# Patient Record
Sex: Male | Born: 1950 | Race: White | Hispanic: No | Smoking: Current every day smoker
Health system: Southern US, Community
[De-identification: ages and names within clinical notes are randomized; demographics above are authoritative.]

## PROBLEM LIST (undated history)

## (undated) DIAGNOSIS — K449 Diaphragmatic hernia without obstruction or gangrene: Secondary | ICD-10-CM

## (undated) DIAGNOSIS — I10 Essential (primary) hypertension: Secondary | ICD-10-CM

## (undated) DIAGNOSIS — R079 Chest pain, unspecified: Secondary | ICD-10-CM

## (undated) DIAGNOSIS — D735 Infarction of spleen: Secondary | ICD-10-CM

## (undated) DIAGNOSIS — N189 Chronic kidney disease, unspecified: Secondary | ICD-10-CM

## (undated) DIAGNOSIS — H68109 Unspecified obstruction of Eustachian tube, unspecified ear: Secondary | ICD-10-CM

## (undated) DIAGNOSIS — C4490 Unspecified malignant neoplasm of skin, unspecified: Secondary | ICD-10-CM

## (undated) HISTORY — DX: Unspecified obstruction of eustachian tube, unspecified ear: H68.109

## (undated) HISTORY — PX: HERNIA REPAIR: SHX51

## (undated) HISTORY — DX: Infarction of spleen: D73.5

## (undated) HISTORY — DX: Essential (primary) hypertension: I10

## (undated) HISTORY — DX: Diaphragmatic hernia without obstruction or gangrene: K44.9

## (undated) HISTORY — DX: Chest pain, unspecified: R07.9

## (undated) HISTORY — DX: Unspecified malignant neoplasm of skin, unspecified: C44.90

---

## 2000-01-05 ENCOUNTER — Ambulatory Visit (HOSPITAL_COMMUNITY): Admission: RE | Admit: 2000-01-05 | Discharge: 2000-01-05 | Payer: Self-pay | Admitting: General Surgery

## 2010-10-22 ENCOUNTER — Encounter: Payer: Self-pay | Admitting: Urology

## 2012-06-24 ENCOUNTER — Encounter: Payer: Self-pay | Admitting: *Deleted

## 2012-06-25 ENCOUNTER — Encounter: Payer: Self-pay | Admitting: Cardiology

## 2012-06-25 ENCOUNTER — Encounter: Payer: Self-pay | Admitting: *Deleted

## 2012-06-25 ENCOUNTER — Ambulatory Visit (INDEPENDENT_AMBULATORY_CARE_PROVIDER_SITE_OTHER): Payer: Self-pay | Admitting: Cardiology

## 2012-06-25 VITALS — BP 220/130 | HR 82 | Ht 69.0 in | Wt 224.0 lb

## 2012-06-25 DIAGNOSIS — I10 Essential (primary) hypertension: Secondary | ICD-10-CM

## 2012-06-25 DIAGNOSIS — R079 Chest pain, unspecified: Secondary | ICD-10-CM

## 2012-06-25 DIAGNOSIS — H68109 Unspecified obstruction of Eustachian tube, unspecified ear: Secondary | ICD-10-CM

## 2012-06-25 MED ORDER — LISINOPRIL-HYDROCHLOROTHIAZIDE 20-12.5 MG PO TABS: 2.0000 | ORAL_TABLET | Freq: Every day | ORAL | Status: DC

## 2012-06-25 MED ORDER — AMLODIPINE BESYLATE 5 MG PO TABS: 5.0000 mg | ORAL_TABLET | Freq: Every day | ORAL | Status: DC

## 2012-06-25 NOTE — Progress Notes (Signed)
**Note Jeremiah-Identified via Obfuscation** Patient ID: Jeremiah Calhoun, male   DOB: 01/05/1951, 61 y.o.   MRN: [id number]  HPI: Initial Cardiology visit performed at the kind request of Dr. Renard Matter for evaluation of exertional dyspnea, exertional chest discomfort and hypertension.  This nice gentleman as being generally active and free of cardiac disease.  He has had long-standing hypertension that has been fairly well-controlled.  He has hyperlipidemia that is also treated pharmacologically.  He has not previously been evaluated by a cardiologist nor undergone any significant cardiac testing.  Over the past few months, he has noted a decline in exercise tolerance.  With marked exertion, he experiences dyspnea and chest aching with radiation to both arms.  Symptoms resolve quickly with rest.  Current Outpatient Prescriptions on File Prior to Visit  Medication Sig Dispense Refill  . atorvastatin (LIPITOR) 20 MG tablet Take 20 mg by mouth daily.      . Azilsartan-Chlorthalidone (EDARBYCLOR) 40-12.5 MG TABS Take 1 tablet by mouth daily.      . hydrALAZINE (APRESOLINE) 25 MG tablet Take 25 mg by mouth 2 (two) times daily.        Past Medical History  Diagnosis Date  . Hypertension   . Blocked eustachian tube     Past Surgical History  Procedure Date  . Hernia repair     Family History  Problem Relation Age of Onset  . Heart attack Mother 51    Cause of death  . Heart attack Father 34    Cause of death  . Hypertension Sister     History   Social History  . Marital Status: Divorced    Spouse Name: N/A    Number of Children: N/A  . Years of Education: N/A   Occupational History  . Not on file.   Social History Main Topics  . Smoking status: Current Every Day Smoker -- 1.5 packs/day for 15 years    Types: Cigarettes  . Smokeless tobacco: Not on file  . Alcohol Use: Yes     Rare  . Drug Use: No  . Sexually Active: Not on file   Other Topics Concern  . Not on file   Social History Narrative  . No narrative on file      ROS: Recent fullness in the left ear; evaluated by PCP; nasal decongestant prescribed.  All other systems reviewed and are negative.  PHYSICAL EXAM: BP 220/130  Pulse 82  Ht 5\' 9"  (1.753 m)  Wt 101.606 kg (224 lb)  BMI 33.08 kg/m2  General-Well-developed; no acute distress Body [place]-overweight HEENT-[place]/AT; PERRL; EOM intact; conjunctiva and lids nl Neck-No JVD; no carotid bruits Endocrine-No thyromegaly Lungs-Clear lung fields; resonant percussion; normal I-to-E ratio Cardiovascular- normal PMI; normal S1 and S2 Abdomen-BS normal; soft and non-tender without masses or organomegaly Musculoskeletal-No deformities, cyanosis or clubbing Neurologic-Nl cranial nerves; symmetric strength and tone Skin- Warm, no significant lesions Extremities-Nl distal pulses; trace edema  EKG:  Normal this rhythm, abnormal R wave progression-lead placement error versus anterior infarction, minor nonspecific ST segment abnormality   ASSESSMENT AND PLAN:  Jeremiah Baca Bing, MD 06/25/2012 2:08 PM

## 2012-06-25 NOTE — Assessment & Plan Note (Signed)
Suboptimal control of hyperlipidemia.  Additional medical therapy should be considered.

## 2012-06-25 NOTE — Patient Instructions (Signed)
Your physician recommends that you schedule a follow-up appointment in: Day of stress test  Stress Myoview  Your physician recommends that you return for lab work in: 2 weeks and 4 weeks (you will receive a letter)  Your physician has requested that you regularly monitor and record your blood pressure readings at home. Please use the same machine at the same time of day to check your readings and record them to bring to your follow-up visit. (Call us if top number is > 170 or bottom number < 110)  Your physician has recommended you make the following change in your medication:  1 - START Lisinopril 20/12.5 2 tablets daily 2 - START Amlodipine 5 mg daily 3 - STOP Hydralazine 4 - STOP Edarbyclor  STOP Smoking  NO Strenuous Activity

## 2012-06-25 NOTE — Progress Notes (Deleted)
Name: Jeremiah Calhoun    DOB: 1951-08-22  Age: 61 y.o.  MR#: [id number]       PCP:  Alice Reichert, MD      Insurance: @PAYORNAME @   CC:    Chief Complaint  Patient presents with  . Chest Pain    VS BP 220/130  Pulse 82  Ht 5\' 9"  (1.753 m)  Wt 224 lb (101.606 kg)  BMI 33.08 kg/m2  Weights Current Weight  06/25/12 224 lb (101.606 kg)    Blood Pressure  BP Readings from Last 3 Encounters:  06/25/12 220/130     Admit date:  (Not on file) Last encounter with RMR:  Visit date not found   Allergy Not on File  Current Outpatient Prescriptions  Medication Sig Dispense Refill  . atorvastatin (LIPITOR) 20 MG tablet Take 20 mg by mouth daily.      . Azilsartan-Chlorthalidone (EDARBYCLOR) 40-12.5 MG TABS Take 1 tablet by mouth daily.      . hydrALAZINE (APRESOLINE) 25 MG tablet Take 25 mg by mouth 2 (two) times daily.        Discontinued Meds:   There are no discontinued medications.  There is no problem list on file for this patient.   LABS No results found for any previous visit.   Results for this Opt Visit:    No results found for this or any previous visit.  EKG No orders found for this or any previous visit.   Prior Assessment and Plan    Imaging: No results found.   FRS Calculation: Score not calculated. Missing: Total Cholesterol, HDL

## 2012-06-25 NOTE — Assessment & Plan Note (Signed)
Chest discomforthis consistent with exercise-induced ischemia, but may simply represent exercise-induced dyspnea.  Blood pressure is currently too high to allow stress testing to proceed safely.  Patient advised not to perform any significant exertion pending a stress test next week.

## 2012-06-25 NOTE — Assessment & Plan Note (Addendum)
History of hypokalemia with diuretic therapy.  Serum electrolytes will be reassessed.  Control of hypertension is inadequate.  Moreover, patient cannot afford his current medication, which will be discontinued.  Lisinopril-HCT 40/25 mg will be started as well as amlodipine 10 mg per day.  Patient will monitor blood pressure at home and return next week for treadmill testing.

## 2012-07-03 ENCOUNTER — Encounter (HOSPITAL_COMMUNITY): Payer: Self-pay | Admitting: *Deleted

## 2012-07-03 ENCOUNTER — Ambulatory Visit (HOSPITAL_COMMUNITY)
Admission: RE | Admit: 2012-07-03 | Discharge: 2012-07-03 | Disposition: A | Discharge status: Home / Self Care | Payer: Self-pay | Source: Ambulatory Visit | Attending: Cardiology | Admitting: Cardiology

## 2012-07-03 ENCOUNTER — Encounter: Payer: Self-pay | Admitting: Cardiology

## 2012-07-03 ENCOUNTER — Encounter (HOSPITAL_COMMUNITY)
Admission: RE | Admit: 2012-07-03 | Discharge: 2012-07-03 | Disposition: A | Discharge status: Home / Self Care | Payer: Self-pay | Source: Ambulatory Visit | Attending: Cardiology | Admitting: Cardiology

## 2012-07-03 ENCOUNTER — Encounter (HOSPITAL_COMMUNITY): Payer: Self-pay

## 2012-07-03 DIAGNOSIS — R079 Chest pain, unspecified: Secondary | ICD-10-CM

## 2012-07-03 DIAGNOSIS — I10 Essential (primary) hypertension: Secondary | ICD-10-CM

## 2012-07-03 MED ORDER — TECHNETIUM TC 99M SESTAMIBI - CARDIOLITE
10.0000 | Freq: Once | INTRAVENOUS | Status: AC | PRN
  Administered 2012-07-03: 9.5 via INTRAVENOUS

## 2012-07-03 MED ORDER — REGADENOSON 0.4 MG/5ML IV SOLN
INTRAVENOUS | Status: AC
  Filled 2012-07-03: qty 5

## 2012-07-03 MED ORDER — TECHNETIUM TC 99M SESTAMIBI - CARDIOLITE
30.0000 | Freq: Once | INTRAVENOUS | Status: AC | PRN
  Administered 2012-07-03: 30 via INTRAVENOUS

## 2012-07-03 MED ORDER — SODIUM CHLORIDE 0.9 % IJ SOLN
INTRAMUSCULAR | Status: AC
  Administered 2012-07-03: 10 mL via INTRAVENOUS
  Filled 2012-07-03: qty 10

## 2012-07-03 MED ORDER — METOPROLOL SUCCINATE ER 100 MG PO TB24: 100.0000 mg | ORAL_TABLET | Freq: Every day | ORAL | Status: DC

## 2012-07-03 NOTE — Progress Notes (Signed)
Stress Lab Nurses Notes - [facility]  DEMONE GAVIDIA 07/03/2012 Reason for doing test: Chest Pain and Dyspnea Type of test: Stress Cardiolite Nurse performing test: Parke Poisson, RN Nuclear Medicine Tech: Lyndel Pleasure Echo Tech: Not Applicable MD performing test: R. Rothbart Family MD: McInnis Test explained and consent signed: yes IV started: 22g jelco, Saline lock flushed, No redness or edema and Saline lock started in radiology Symptoms: Fatigue & SOB Treatment/Intervention: None Reason test stopped: fatigue and reached target HR After recovery IV was: Discontinued via X-ray tech and No redness or edema Patient to return to Nuc. Med at : 12:00 Patient discharged: Home Patient's Condition upon discharge was: stable Comments: During test peak BP 222/104 & HR 139.  Recovery BP 158/103 & HR 94.  Symptoms resolved in recovery. Erskine Speed T

## 2012-07-03 NOTE — Progress Notes (Signed)
Per Dr Dietrich Pates, cancel today's appointment and send in a script for Toprol 100 mg 1/2 tablet daily.

## 2012-07-04 ENCOUNTER — Other Ambulatory Visit: Payer: Self-pay | Admitting: *Deleted

## 2012-07-04 DIAGNOSIS — R079 Chest pain, unspecified: Secondary | ICD-10-CM

## 2012-07-07 ENCOUNTER — Encounter: Payer: Self-pay | Admitting: *Deleted

## 2012-07-07 NOTE — Progress Notes (Signed)
This encounter was created in error - please disregard.

## 2012-07-09 LAB — BASIC METABOLIC PANEL
BUN: 22 mg/dL (ref 6–23)
CO2: 32 mEq/L (ref 19–32)
Calcium: 9.7 mg/dL (ref 8.4–10.5)
Chloride: 99 mEq/L (ref 96–112)
Creat: 1.43 mg/dL — ABNORMAL HIGH (ref 0.50–1.35)
Glucose, Bld: 81 mg/dL (ref 70–99)
Potassium: 3.6 mEq/L (ref 3.5–5.3)
Sodium: 141 mEq/L (ref 135–145)

## 2012-07-10 ENCOUNTER — Encounter: Payer: Self-pay | Admitting: *Deleted

## 2012-07-10 ENCOUNTER — Other Ambulatory Visit: Payer: Self-pay | Admitting: *Deleted

## 2012-07-10 ENCOUNTER — Encounter: Payer: Self-pay | Admitting: Cardiology

## 2012-07-10 DIAGNOSIS — I10 Essential (primary) hypertension: Secondary | ICD-10-CM

## 2012-07-10 DIAGNOSIS — N182 Chronic kidney disease, stage 2 (mild): Secondary | ICD-10-CM | POA: Insufficient documentation

## 2012-07-11 ENCOUNTER — Encounter: Payer: Self-pay | Admitting: *Deleted

## 2012-07-18 ENCOUNTER — Other Ambulatory Visit: Payer: Self-pay | Admitting: *Deleted

## 2012-07-18 DIAGNOSIS — R079 Chest pain, unspecified: Secondary | ICD-10-CM

## 2012-07-23 LAB — BASIC METABOLIC PANEL
BUN: 21 mg/dL (ref 6–23)
CO2: 30 mEq/L (ref 19–32)
Calcium: 9.3 mg/dL (ref 8.4–10.5)
Chloride: 98 mEq/L (ref 96–112)
Creat: 1.53 mg/dL — ABNORMAL HIGH (ref 0.50–1.35)
Glucose, Bld: 95 mg/dL (ref 70–99)
Potassium: 3.2 mEq/L — ABNORMAL LOW (ref 3.5–5.3)
Sodium: 137 mEq/L (ref 135–145)

## 2012-07-24 ENCOUNTER — Encounter: Payer: Self-pay | Admitting: Cardiology

## 2012-07-29 ENCOUNTER — Other Ambulatory Visit: Payer: Self-pay | Admitting: *Deleted

## 2012-07-29 ENCOUNTER — Encounter: Payer: Self-pay | Admitting: *Deleted

## 2012-07-29 DIAGNOSIS — I10 Essential (primary) hypertension: Secondary | ICD-10-CM

## 2012-08-13 ENCOUNTER — Encounter: Payer: Self-pay | Admitting: *Deleted

## 2012-08-13 ENCOUNTER — Ambulatory Visit (INDEPENDENT_AMBULATORY_CARE_PROVIDER_SITE_OTHER): Payer: Self-pay | Admitting: Cardiology

## 2012-08-13 ENCOUNTER — Encounter: Payer: Self-pay | Admitting: Cardiology

## 2012-08-13 VITALS — BP 220/130 | HR 70 | Ht 69.0 in | Wt 220.0 lb

## 2012-08-13 DIAGNOSIS — I10 Essential (primary) hypertension: Secondary | ICD-10-CM

## 2012-08-13 DIAGNOSIS — H68109 Unspecified obstruction of Eustachian tube, unspecified ear: Secondary | ICD-10-CM

## 2012-08-13 DIAGNOSIS — E876 Hypokalemia: Secondary | ICD-10-CM | POA: Insufficient documentation

## 2012-08-13 DIAGNOSIS — R079 Chest pain, unspecified: Secondary | ICD-10-CM

## 2012-08-13 DIAGNOSIS — E782 Mixed hyperlipidemia: Secondary | ICD-10-CM

## 2012-08-13 MED ORDER — AMLODIPINE BESYLATE 10 MG PO TABS: 10.0000 mg | ORAL_TABLET | Freq: Every day | ORAL | Status: DC

## 2012-08-13 MED ORDER — SPIRONOLACTONE 25 MG PO TABS: 25.0000 mg | ORAL_TABLET | Freq: Every day | ORAL | Status: DC

## 2012-08-13 MED ORDER — METOPROLOL SUCCINATE ER 100 MG PO TB24: 100.0000 mg | ORAL_TABLET | Freq: Every day | ORAL | Status: DC

## 2012-08-13 NOTE — Progress Notes (Deleted)
Name: Jeremiah Calhoun    DOB: 11/04/1950  Age: 61 y.o.  MR#: [id number]       PCP:  Alice Reichert, MD      Insurance: @PAYORNAME @   CC:   No chief complaint on file.  Medication list reviewed BMET due 11/25, due to K+ of 3.2  VS BP 184/110  Pulse 70  Ht 5\' 9"  (1.753 m)  Wt 220 lb (99.791 kg)  BMI 32.49 kg/m2  SpO2 96%  Weights Current Weight  08/13/12 220 lb (99.791 kg)  06/25/12 224 lb (101.606 kg)    Blood Pressure  BP Readings from Last 3 Encounters:  08/13/12 184/110  06/25/12 220/130     Admit date:  (Not on file) Last encounter with RMR:  07/24/2012   Allergy Allergies  Allergen Reactions  . Codeine Nausea And Vomiting    Current Outpatient Prescriptions  Medication Sig Dispense Refill  . amLODipine (NORVASC) 5 MG tablet Take 1 tablet (5 mg total) by mouth daily.  30 tablet  11  . Ascorbic Acid (VITAMIN C) 1000 MG tablet Take 1,000 mg by mouth daily.      Marland Kitchen aspirin 81 MG tablet Take 81 mg by mouth daily.      Marland Kitchen atorvastatin (LIPITOR) 20 MG tablet Take 20 mg by mouth daily.      . Coenzyme Q10 (CO Q 10) 100 MG CAPS Take 100 mg by mouth daily.      . Flaxseed, Linseed, (FLAX SEED OIL) 1000 MG CAPS Take 1,000 mg by mouth daily.      Marland Kitchen lisinopril-hydrochlorothiazide (PRINZIDE,ZESTORETIC) 20-12.5 MG per tablet Take 2 tablets by mouth daily.  60 tablet  11  . Lycopene 10 MG CAPS Take 10 mg by mouth daily.      . metoprolol succinate (TOPROL-XL) 100 MG 24 hr tablet Take 1 tablet (100 mg total) by mouth daily. Take 1/2 tablet with or immediately following a meal.  30 tablet  6  . Multiple Vitamin (MULTIVITAMIN) tablet Take 1 tablet by mouth daily.      . Omega-3 Fatty Acids (FISH OIL) 1200 MG CAPS Take 1,200 mg by mouth daily.      . potassium chloride SA (K-DUR,KLOR-CON) 20 MEQ tablet Take 20 mEq by mouth daily.      . saw palmetto 160 MG capsule Take 160 mg by mouth daily.        Discontinued Meds:   There are no discontinued medications.  Patient Active Problem  List  Diagnosis  . Chest pain, exertional  . Hyperlipidemia  . Hypertension  . Chronic kidney disease, stage II (mild)    LABS Orders Only on 07/18/2012  Component Date Value  . Sodium 07/23/2012 137   . Potassium 07/23/2012 3.2*  . Chloride 07/23/2012 98   . CO2 07/23/2012 30   . Glucose, Bld 07/23/2012 95   . BUN 07/23/2012 21   . Creat 07/23/2012 1.53*  . Calcium 07/23/2012 9.3   Orders Only on 07/04/2012  Component Date Value  . Sodium 07/09/2012 141   . Potassium 07/09/2012 3.6   . Chloride 07/09/2012 99   . CO2 07/09/2012 32   . Glucose, Bld 07/09/2012 81   . BUN 07/09/2012 22   . Creat 07/09/2012 1.43*  . Calcium 07/09/2012 9.7      Results for this Opt Visit:     Results for orders placed in visit on 07/18/12  BASIC METABOLIC PANEL      Component Value Range   Sodium  137  135 - 145 mEq/L   Potassium 3.2 (*) 3.5 - 5.3 mEq/L   Chloride 98  96 - 112 mEq/L   CO2 30  19 - 32 mEq/L   Glucose, Bld 95  70 - 99 mg/dL   BUN 21  6 - 23 mg/dL   Creat 1.61 (*) 0.96 - 1.35 mg/dL   Calcium 9.3  8.4 - 04.5 mg/dL    EKG Orders placed in visit on 06/25/12  . EKG 12-LEAD     Prior Assessment and Plan Problem List as of 08/13/2012            Cardiology Problems   Hypertension   Last Assessment & Plan Note   06/25/2012 Office Visit Addendum 07/01/2012  9:11 PM by Kathlen Brunswick, MD    History of hypokalemia with diuretic therapy.  Serum electrolytes will be reassessed.  Control of hypertension is inadequate.  Moreover, patient cannot afford his current medication, which will be discontinued.  Lisinopril-HCT 40/25 mg will be started as well as amlodipine 10 mg per day.  Patient will monitor blood pressure at home and return next week for treadmill testing.      Other   Chest pain, exertional   Last Assessment & Plan Note   06/25/2012 Office Visit Signed 06/25/2012  3:25 PM by Kathlen Brunswick, MD    Chest discomforthis consistent with exercise-induced ischemia, but  may simply represent exercise-induced dyspnea.  Blood pressure is currently too high to allow stress testing to proceed safely.  Patient advised not to perform any significant exertion pending a stress test next week.     Hyperlipidemia   Last Assessment & Plan Note   06/25/2012 Office Visit Signed 06/25/2012  3:26 PM by Kathlen Brunswick, MD    Suboptimal control of hyperlipidemia.  Additional medical therapy should be considered.    Chronic kidney disease, stage II (mild)       Imaging: No results found.   FRS Calculation: Score not calculated. Missing: Total Cholesterol, HDL

## 2012-08-13 NOTE — Progress Notes (Signed)
Patient ID: Jeremiah Calhoun, male   DOB: Jun 13, 1951, 61 y.o.   MRN: [id number]  HPI: Scheduled return visit for this very nice gentleman with hypertension and exertional chest discomfort.  Since appropriate medical therapy was initiated, chest discomfort has resolved.  Exercise tolerance is also improved back to normal.  He is splitting wood and hunting without any cardiopulmonary symptoms.  Prior to Admission medications   Medication Sig Start Date End Date Taking? Authorizing Provider  amLODipine (NORVASC) 5 MG tablet Take 1 tablet (5 mg total) by mouth daily. 06/25/12  Yes Kathlen Brunswick, MD  Ascorbic Acid (VITAMIN C) 1000 MG tablet Take 1,000 mg by mouth daily.   Yes Historical Provider, MD  aspirin 81 MG tablet Take 81 mg by mouth daily.   Yes Historical Provider, MD  atorvastatin (LIPITOR) 20 MG tablet Take 20 mg by mouth daily.   Yes Historical Provider, MD  Coenzyme Q10 (CO Q 10) 100 MG CAPS Take 100 mg by mouth daily.   Yes Historical Provider, MD  Flaxseed, Linseed, (FLAX SEED OIL) 1000 MG CAPS Take 1,000 mg by mouth daily.   Yes Historical Provider, MD  lisinopril-hydrochlorothiazide (PRINZIDE,ZESTORETIC) 20-12.5 MG per tablet Take 2 tablets by mouth daily. 06/25/12  Yes Kathlen Brunswick, MD  Lycopene 10 MG CAPS Take 10 mg by mouth daily.   Yes Historical Provider, MD  metoprolol succinate (TOPROL-XL) 100 MG 24 hr tablet Take 1 tablet (100 mg total) by mouth daily. Take 1/2 tablet with or immediately following a meal. 07/03/12  Yes Kathlen Brunswick, MD  Multiple Vitamin (MULTIVITAMIN) tablet Take 1 tablet by mouth daily.   Yes Historical Provider, MD  Omega-3 Fatty Acids (FISH OIL) 1200 MG CAPS Take 1,200 mg by mouth daily.   Yes Historical Provider, MD  potassium chloride SA (K-DUR,KLOR-CON) 20 MEQ tablet Take 20 mEq by mouth daily.   Yes Historical Provider, MD  saw palmetto 160 MG capsule Take 160 mg by mouth daily.   Yes Historical Provider, MD   Allergies  Allergen Reactions  .  Codeine Nausea And Vomiting     Past medical history, social history, and family history reviewed and updated.  ROS: Denies orthopnea, PND, pedal edema or syncope.  He notes mild intermittent diarrhea with 2 stools per day.  Nocturia 2 times per night resolved when he added lycopene to his other nutriceuticals.  All other systems reviewed and are negative.  PHYSICAL EXAM: BP 220/130  Pulse 70  Ht 5\' 9"  (1.753 m)  Wt 99.791 kg (220 lb)  BMI 32.49 kg/m2  SpO2 96% ; subsequent blood pressure 180/110 General-Well developed; no acute distress Body habitus-Mildly overweight Neck-No JVD; no carotid bruits Lungs-clear lung fields; resonant to percussion Cardiovascular-normal PMI; normal S1 and S2 Abdomen-normal bowel sounds; soft and non-tender without masses or organomegaly Musculoskeletal-No deformities, no cyanosis or clubbing Neurologic-Normal cranial nerves; symmetric strength and tone Skin-Warm, no significant lesions Extremities-distal pulses intact; no edema  ASSESSMENT AND PLAN:  [place] Bing, MD 08/13/2012 2:02 PM

## 2012-08-13 NOTE — Patient Instructions (Addendum)
Your physician recommends that you schedule a follow-up appointment in:  1 - 1 month with nurse for blood pressure check 2 - 2 months with provider  Your physician recommends that you return for lab work in: 3 weeks and you will receive a reminder letter  Your physician has recommended you make the following change in your medication:  1 - INCREASE Amlodipine to 10 mg daily 2 - INCREASE Toprol to 100 mg daily 3 - START Aldactone 25 mg daily 4 - STOP Potassium

## 2012-08-13 NOTE — Assessment & Plan Note (Signed)
Blood pressure control is inadequate.  Dose of amlodipine and metoprolol will be increased.  Aldactone will be added to antihypertensive regime with discontinuation of potassium supplement and close monitoring of electrolytes and renal function.

## 2012-08-13 NOTE — Assessment & Plan Note (Signed)
Repeat lipid profile on therapy will be obtained.

## 2012-08-13 NOTE — Assessment & Plan Note (Signed)
Stable renal function despite treatment with thiazide diuretic and ACE inhibitor.  We will continue to monitor electrolytes and renal function closely as his antihypertensive regimen is adjusted.

## 2012-08-13 NOTE — Assessment & Plan Note (Signed)
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  In the absence of ischemia, this is a low risk study.  We will optimize therapy of cardiovascular risk factors, but defer additional diagnostic testing for coronary artery disease.

## 2012-08-27 ENCOUNTER — Other Ambulatory Visit: Payer: Self-pay | Admitting: *Deleted

## 2012-08-27 DIAGNOSIS — E782 Mixed hyperlipidemia: Secondary | ICD-10-CM

## 2012-08-27 DIAGNOSIS — I10 Essential (primary) hypertension: Secondary | ICD-10-CM

## 2012-09-03 LAB — BASIC METABOLIC PANEL
BUN: 22 mg/dL (ref 6–23)
CO2: 27 mEq/L (ref 19–32)
Calcium: 9.6 mg/dL (ref 8.4–10.5)
Chloride: 105 mEq/L (ref 96–112)
Creat: 1.34 mg/dL (ref 0.50–1.35)
Glucose, Bld: 99 mg/dL (ref 70–99)
Potassium: 4.4 mEq/L (ref 3.5–5.3)
Sodium: 140 mEq/L (ref 135–145)

## 2012-09-03 LAB — LIPID PANEL
Cholesterol: 169 mg/dL (ref 0–200)
HDL: 33 mg/dL — ABNORMAL LOW (ref >39)
LDL Cholesterol: 108 mg/dL — ABNORMAL HIGH (ref 0–99)
Total CHOL/HDL Ratio: 5.1 Ratio
Triglycerides: 142 mg/dL (ref <150)
VLDL: 28 mg/dL (ref 0–40)

## 2012-09-04 ENCOUNTER — Encounter: Payer: Self-pay | Admitting: Cardiology

## 2012-09-08 ENCOUNTER — Other Ambulatory Visit: Payer: Self-pay | Admitting: *Deleted

## 2012-09-12 ENCOUNTER — Ambulatory Visit (INDEPENDENT_AMBULATORY_CARE_PROVIDER_SITE_OTHER): Payer: Self-pay | Admitting: *Deleted

## 2012-09-12 VITALS — BP 138/90 | HR 75 | Ht 69.0 in | Wt 223.0 lb

## 2012-09-12 DIAGNOSIS — I10 Essential (primary) hypertension: Secondary | ICD-10-CM

## 2012-09-12 NOTE — Progress Notes (Signed)
Presents for return BP check.  States that he has been unable to keep track of blood pressures at home, due to financial constraints and he is unable to purchase a new cuff, as his is not working properly.  VS on 11/13....BP 220/130  Pulse 70 orders were as follows:  1 - INCREASE Amlodipine to 10 mg daily  2 - INCREASE Toprol to 100 mg daily  3 - START Aldactone 25 mg daily  4 - STOP Potassium    Denies complaints and states that he feels much better at this point, however has not been taking atorvastatin due to cost.  We could consider Crestor and enroll in free program, as well as samples.  Please advise

## 2012-09-13 NOTE — Progress Notes (Signed)
Control of hypertension is markedly improved. Change atorvastatin to pravastatin 80 mg per day. CMet and fasting lipid profile in one month.

## 2012-09-13 NOTE — Progress Notes (Signed)
Patient ID: Jeremiah Calhoun, male   DOB: 10-Jul-1951, 61 y.o.   MRN: [id number]  Dramatic improvement in control of hypertension. Patient could monitor blood pressure at local pharmacies or at our office if he has transportation. Pravastatin 80 mg per day; fasting lipid profile in one month.

## 2012-09-15 NOTE — Progress Notes (Signed)
Attempted to contact patient LM for a return call.

## 2012-09-16 ENCOUNTER — Encounter: Payer: Self-pay | Admitting: *Deleted

## 2012-09-16 ENCOUNTER — Other Ambulatory Visit: Payer: Self-pay | Admitting: *Deleted

## 2012-09-16 DIAGNOSIS — E782 Mixed hyperlipidemia: Secondary | ICD-10-CM

## 2012-09-16 MED ORDER — PRAVASTATIN SODIUM 80 MG PO TABS: 80.0000 mg | ORAL_TABLET | Freq: Every evening | ORAL | Status: DC

## 2012-09-16 NOTE — Telephone Encounter (Signed)
Unable to get in touch with patient, therefore a letter has been sent to instruct him to begin pravastatin 80 mg daily.

## 2012-09-17 ENCOUNTER — Telehealth: Payer: Self-pay | Admitting: *Deleted

## 2012-09-17 NOTE — Telephone Encounter (Signed)
Patient verbalized understanding of medication changes and will pick pravastatin up today.

## 2012-10-17 ENCOUNTER — Other Ambulatory Visit: Payer: Self-pay | Admitting: Cardiology

## 2012-10-17 ENCOUNTER — Ambulatory Visit: Payer: Self-pay | Admitting: Cardiology

## 2012-10-17 DIAGNOSIS — E782 Mixed hyperlipidemia: Secondary | ICD-10-CM

## 2012-10-17 LAB — LIPID PANEL
Cholesterol: 138 mg/dL (ref 0–200)
HDL: 35 mg/dL — ABNORMAL LOW (ref >39)
LDL Cholesterol: 72 mg/dL (ref 0–99)
Total CHOL/HDL Ratio: 3.9 Ratio
Triglycerides: 155 mg/dL — ABNORMAL HIGH (ref <150)
VLDL: 31 mg/dL (ref 0–40)

## 2012-10-22 ENCOUNTER — Encounter: Payer: Self-pay | Admitting: *Deleted

## 2012-11-13 ENCOUNTER — Ambulatory Visit: Payer: Self-pay | Admitting: Cardiology

## 2012-11-28 ENCOUNTER — Encounter: Payer: Self-pay | Admitting: *Deleted

## 2012-11-28 ENCOUNTER — Encounter: Payer: Self-pay | Admitting: Cardiology

## 2012-11-28 ENCOUNTER — Ambulatory Visit (INDEPENDENT_AMBULATORY_CARE_PROVIDER_SITE_OTHER): Payer: Self-pay | Admitting: Cardiology

## 2012-11-28 VITALS — BP 158/98 | HR 73 | Ht 69.0 in | Wt 221.4 lb

## 2012-11-28 DIAGNOSIS — N182 Chronic kidney disease, stage 2 (mild): Secondary | ICD-10-CM

## 2012-11-28 DIAGNOSIS — R079 Chest pain, unspecified: Secondary | ICD-10-CM

## 2012-11-28 DIAGNOSIS — I10 Essential (primary) hypertension: Secondary | ICD-10-CM

## 2012-11-28 DIAGNOSIS — E876 Hypokalemia: Secondary | ICD-10-CM

## 2012-11-28 NOTE — Progress Notes (Deleted)
Name: Jeremiah Calhoun    DOB: [birth year]  Age: 62 y.o.  MR#: [id number]       PCP:  Alice Reichert, MD      Insurance: Payor:  No coverage found.   CC:   No chief complaint on file.  MEDICATION LIST  VS Filed Vitals:   11/28/12 1436  BP: 158/98  Pulse: 73  Height: 5\' 9"  (1.753 m)  Weight: 221 lb 6.4 oz (100.426 kg)  SpO2: 97%    Weights Current Weight  11/28/12 221 lb 6.4 oz (100.426 kg)  09/12/12 223 lb (101.152 kg)  08/13/12 220 lb (99.791 kg)    Blood Pressure  BP Readings from Last 3 Encounters:  11/28/12 158/98  09/12/12 138/90  08/13/12 220/130     Admit date:  (Not on file) Last encounter with RMR:  11/13/2012   Allergy Codeine  Current Outpatient Prescriptions  Medication Sig Dispense Refill  . amLODipine (NORVASC) 10 MG tablet Take 1 tablet (10 mg total) by mouth daily.  90 tablet  3  . Ascorbic Acid (VITAMIN C) 1000 MG tablet Take 1,000 mg by mouth daily.      Marland Kitchen aspirin 81 MG tablet Take 81 mg by mouth daily.      . Coenzyme Q10 (CO Q 10) 100 MG CAPS Take 100 mg by mouth daily.      . Flaxseed, Linseed, (FLAX SEED OIL) 1000 MG CAPS Take 1,000 mg by mouth daily.      Marland Kitchen lisinopril-hydrochlorothiazide (PRINZIDE,ZESTORETIC) 20-12.5 MG per tablet Take 2 tablets by mouth daily.  60 tablet  11  . Lycopene 10 MG CAPS Take 10 mg by mouth daily.      . metoprolol succinate (TOPROL-XL) 100 MG 24 hr tablet Take 1 tablet (100 mg total) by mouth daily. Take with or immediately following a meal.  90 tablet  3  . Multiple Vitamin (MULTIVITAMIN) tablet Take 1 tablet by mouth daily.      . Omega-3 Fatty Acids (FISH OIL) 1200 MG CAPS Take 1,200 mg by mouth daily.      . pravastatin (PRAVACHOL) 80 MG tablet Take 1 tablet (80 mg total) by mouth every evening.  30 tablet  6  . saw palmetto 160 MG capsule Take 160 mg by mouth daily.      Marland Kitchen spironolactone (ALDACTONE) 25 MG tablet Take 1 tablet (25 mg total) by mouth daily.  90 tablet  3   No current facility-administered medications  for this visit.    Discontinued Meds:   There are no discontinued medications.  Patient Active Problem List  Diagnosis  . Chest pain, exertional  . Hyperlipidemia  . Hypertension  . Chronic kidney disease, stage II (mild)  . Hypokalemia    LABS    Component Value Date/Time   NA 140 09/03/2012 0940   NA 137 07/23/2012 0850   NA 141 07/09/2012 1143   K 4.4 09/03/2012 0940   K 3.2* 07/23/2012 0850   K 3.6 07/09/2012 1143   CL 105 09/03/2012 0940   CL 98 07/23/2012 0850   CL 99 07/09/2012 1143   CO2 27 09/03/2012 0940   CO2 30 07/23/2012 0850   CO2 32 07/09/2012 1143   GLUCOSE 99 09/03/2012 0940   GLUCOSE 95 07/23/2012 0850   GLUCOSE 81 07/09/2012 1143   BUN 22 09/03/2012 0940   BUN 21 07/23/2012 0850   BUN 22 07/09/2012 1143   CREATININE 1.34 09/03/2012 0940   CREATININE 1.53* 07/23/2012 0850   CREATININE  1.43* 07/09/2012 1143   CALCIUM 9.6 09/03/2012 0940   CALCIUM 9.3 07/23/2012 0850   CALCIUM 9.7 07/09/2012 1143   CMP     Component Value Date/Time   NA 140 09/03/2012 0940   K 4.4 09/03/2012 0940   CL 105 09/03/2012 0940   CO2 27 09/03/2012 0940   GLUCOSE 99 09/03/2012 0940   BUN 22 09/03/2012 0940   CREATININE 1.34 09/03/2012 0940   CALCIUM 9.6 09/03/2012 0940    No results found for this basename: wbc, hgb, hct, mcv, platelets    Lipid Panel     Component Value Date/Time   CHOL 138 10/17/2012 1011   TRIG 155* 10/17/2012 1011   HDL 35* 10/17/2012 1011   CHOLHDL 3.9 10/17/2012 1011   VLDL 31 10/17/2012 1011   LDLCALC 72 10/17/2012 1011    ABG No results found for this basename: phart, pco2, pco2art, po2, po2art, hco3, tco2, acidbasedef, o2sat     No results found for this basename: TSH   BNP (last 3 results) No results found for this basename: PROBNP,  in the last 8760 hours Cardiac Panel (last 3 results) No results found for this basename: CKTOTAL, CKMB, TROPONINI, RELINDX,  in the last 72 hours  Iron/TIBC/Ferritin No results found for this basename: iron, tibc,  ferritin     EKG Orders placed in visit on 06/25/12  . EKG 12-LEAD     Prior Assessment and Plan Problem List as of 11/28/2012     ICD-9-CM     Cardiology Problems   Hypertension   Last Assessment & Plan   08/13/2012 Office Visit Written 08/13/2012  2:31 PM by Kathlen Brunswick, MD     Blood pressure control is inadequate.  Dose of amlodipine and metoprolol will be increased.  Aldactone will be added to antihypertensive regime with discontinuation of potassium supplement and close monitoring of electrolytes and renal function.      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  In the absence of ischemia, this is a low risk study.  We will optimize therapy of cardiovascular risk factors, but defer additional diagnostic testing for coronary artery disease.    Hyperlipidemia   Last Assessment & Plan   08/13/2012 Office Visit Written 08/13/2012  2:30 PM by Kathlen Brunswick, MD     Repeat lipid profile on therapy will be obtained.    Chronic kidney disease, stage II (mild)   Last Assessment & Plan   08/13/2012 Office Visit Written 08/13/2012  2:07 PM by Kathlen Brunswick, MD     Stable renal function despite treatment with thiazide diuretic and ACE inhibitor.  We will continue to monitor electrolytes and renal function closely as his antihypertensive regimen is adjusted.    Hypokalemia       Imaging: No results found.

## 2012-11-28 NOTE — Patient Instructions (Addendum)
Your physician recommends that you schedule a follow-up appointment in: 6 months  Your physician recommends that you return for lab work in: 3 months

## 2012-11-29 NOTE — Assessment & Plan Note (Signed)
Creatinine is lower than on determinations a few months ago indicating that renal disease is stable to improved and remains mild.

## 2012-11-29 NOTE — Assessment & Plan Note (Signed)
Recent lipid profiles are excellent with current therapy, which will be continued.

## 2012-11-29 NOTE — Assessment & Plan Note (Signed)
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 Continued medical treatment and optimal control of cardiovascular risk factors are appropriate.

## 2012-11-29 NOTE — Assessment & Plan Note (Signed)
Blood pressure control is suboptimal, but exact medical regimen is unclear. We will contact patient's pharmacy and adjust therapy based upon the information received.

## 2012-11-29 NOTE — Progress Notes (Signed)
Patient ID: Jeremiah Calhoun, male   DOB: [birth year], 62 y.o.   MRN: [id number]  HPI: Schedule return visit for this nice gentleman with hypertension and history of chest discomfort. He reports no recent symptoms. Recent blood pressure systems have been elevated.  Patient is uncertain whether she has been taking all of his medications and did not bring them with him.  Current Outpatient Prescriptions  Medication Sig Dispense Refill  . amLODipine (NORVASC) 10 MG tablet Take 1 tablet (10 mg total) by mouth daily.  90 tablet  3  . Ascorbic Acid (VITAMIN C) 1000 MG tablet Take 1,000 mg by mouth daily.      Marland Kitchen aspirin 81 MG tablet Take 81 mg by mouth daily.      . Coenzyme Q10 (CO Q 10) 100 MG CAPS Take 100 mg by mouth daily.      . Flaxseed, Linseed, (FLAX SEED OIL) 1000 MG CAPS Take 1,000 mg by mouth daily.      Marland Kitchen lisinopril-hydrochlorothiazide (PRINZIDE,ZESTORETIC) 20-12.5 MG per tablet Take 2 tablets by mouth daily.  60 tablet  11  . Lycopene 10 MG CAPS Take 10 mg by mouth daily.      . metoprolol succinate (TOPROL-XL) 100 MG 24 hr tablet Take 1 tablet (100 mg total) by mouth daily. Take with or immediately following a meal.  90 tablet  3  . Multiple Vitamin (MULTIVITAMIN) tablet Take 1 tablet by mouth daily.      . Omega-3 Fatty Acids (FISH OIL) 1200 MG CAPS Take 1,200 mg by mouth daily.      . pravastatin (PRAVACHOL) 80 MG tablet Take 1 tablet (80 mg total) by mouth every evening.  30 tablet  6  . saw palmetto 160 MG capsule Take 160 mg by mouth daily.      Marland Kitchen spironolactone (ALDACTONE) 25 MG tablet Take 1 tablet (25 mg total) by mouth daily.  90 tablet  3   No current facility-administered medications for this visit.   Allergies  Allergen Reactions  . Codeine Nausea And Vomiting     Past medical history, social history, and family history reviewed and updated.  ROS: Denies chest pain, palpitations, lightheadedness or syncope. All other systems reviewed and are negative.  PHYSICAL EXAM: BP  158/98  Pulse 73  Ht 5\' 9"  (1.753 m)  Wt 100.426 kg (221 lb 6.4 oz)  BMI 32.68 kg/m2  SpO2 97%;  Body mass index is 32.68 kg/(m^2). General-Well developed; no acute distress Body habitus-mildly to moderately overweight Neck-No JVD; no carotid bruits Lungs-clear lung fields; resonant to percussion Cardiovascular-normal PMI; normal S1 and S2 Abdomen-normal bowel sounds; soft and non-tender without masses or organomegaly Musculoskeletal-No deformities, no cyanosis or clubbing Neurologic-Normal cranial nerves; symmetric strength and tone Skin-Warm, no significant lesions Extremities-distal pulses intact; no edema   Bing, MD 11/29/2012  4:31 PM  ASSESSMENT AND PLAN

## 2013-03-11 ENCOUNTER — Encounter: Payer: Self-pay | Admitting: *Deleted

## 2013-05-01 ENCOUNTER — Other Ambulatory Visit: Payer: Self-pay | Admitting: *Deleted

## 2013-05-01 DIAGNOSIS — N182 Chronic kidney disease, stage 2 (mild): Secondary | ICD-10-CM

## 2013-05-01 DIAGNOSIS — E876 Hypokalemia: Secondary | ICD-10-CM

## 2013-05-01 DIAGNOSIS — I10 Essential (primary) hypertension: Secondary | ICD-10-CM

## 2013-07-09 ENCOUNTER — Other Ambulatory Visit: Payer: Self-pay | Admitting: Cardiology

## 2013-07-28 ENCOUNTER — Telehealth: Payer: Self-pay | Admitting: *Deleted

## 2013-07-28 NOTE — Telephone Encounter (Signed)
.  left message to have patient return my call to call office to scheduled f/u OV per noted pt is overdue, in order to continue to fill medications

## 2013-07-31 ENCOUNTER — Ambulatory Visit (INDEPENDENT_AMBULATORY_CARE_PROVIDER_SITE_OTHER): Payer: Self-pay | Admitting: Cardiovascular Disease

## 2013-07-31 ENCOUNTER — Encounter: Payer: Self-pay | Admitting: Cardiovascular Disease

## 2013-07-31 VITALS — BP 118/70 | HR 69 | Ht 69.0 in | Wt 219.0 lb

## 2013-07-31 DIAGNOSIS — R9439 Abnormal result of other cardiovascular function study: Secondary | ICD-10-CM

## 2013-07-31 DIAGNOSIS — I208 Other forms of angina pectoris: Secondary | ICD-10-CM

## 2013-07-31 DIAGNOSIS — I10 Essential (primary) hypertension: Secondary | ICD-10-CM

## 2013-07-31 DIAGNOSIS — E785 Hyperlipidemia, unspecified: Secondary | ICD-10-CM

## 2013-07-31 DIAGNOSIS — I209 Angina pectoris, unspecified: Secondary | ICD-10-CM

## 2013-07-31 DIAGNOSIS — R079 Chest pain, unspecified: Secondary | ICD-10-CM

## 2013-07-31 DIAGNOSIS — I2089 Other forms of angina pectoris: Secondary | ICD-10-CM

## 2013-07-31 DIAGNOSIS — E782 Mixed hyperlipidemia: Secondary | ICD-10-CM

## 2013-07-31 DIAGNOSIS — R931 Abnormal findings on diagnostic imaging of heart and coronary circulation: Secondary | ICD-10-CM

## 2013-07-31 MED ORDER — LISINOPRIL-HYDROCHLOROTHIAZIDE 20-12.5 MG PO TABS: ORAL_TABLET | ORAL | Status: DC

## 2013-07-31 MED ORDER — METOPROLOL SUCCINATE ER 100 MG PO TB24: 100.0000 mg | ORAL_TABLET | Freq: Every day | ORAL | Status: DC

## 2013-07-31 MED ORDER — AMLODIPINE BESYLATE 10 MG PO TABS: 10.0000 mg | ORAL_TABLET | Freq: Every day | ORAL | Status: DC

## 2013-07-31 MED ORDER — SPIRONOLACTONE 25 MG PO TABS: 25.0000 mg | ORAL_TABLET | Freq: Every day | ORAL | Status: DC

## 2013-07-31 MED ORDER — PRAVASTATIN SODIUM 80 MG PO TABS: 80.0000 mg | ORAL_TABLET | Freq: Every evening | ORAL | Status: DC

## 2013-07-31 NOTE — Addendum Note (Signed)
Addended by: Lisabeth Devoid F on: 07/31/2013 02:44 PM   Modules accepted: Orders

## 2013-07-31 NOTE — Patient Instructions (Addendum)
Your physician wants you to follow-up in: 6 months with Dr. Purvis Sheffield.  You will receive a reminder letter in the mail two months in advance. If you don't receive a letter, please call our office to schedule the follow-up appointment.  Your physician recommends that you continue on your current medications as directed. Please refer to the Current Medication list given to you today.   You will need lab work today: cholesterol panel We will call with your results

## 2013-07-31 NOTE — Progress Notes (Signed)
Patient ID: Jeremiah Calhoun, male   DOB: May 02, 1951, 62 y.o.   MRN: [id number]      SUBJECTIVE: Jeremiah Calhoun has a h/o exertional chest pain, HTN, hyperlipidemia, and CKD. He underwent nuclear stress testing on 07-04-2012 which revealed the following:  IMPRESSION: Abnormal stress nuclear myocardial study revealing somewhat impaired exercise capacity, mild left ventricular enlargement, mildly impaired left ventricular systolic function in a segmental pattern and significant stress-induced ischemic EKG abnormalities in the setting of baseline ST-segment depression and no exercise related chest discomfort. By scintigraphic imaging, there was possible mild scarring at the base of the inferior wall without evidence for ischemia. Other findings as noted. These findings suggest a low risk for significant cardiovascular events in the near and intermediate term.  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 He says, "99.9% of the time I feel ok". He seldomly experiences bilateral arm aching with exertion, particularly when walking uphill, which is relieved with rest. He denies chest pain, palpitations, and lightheadedness. He may feel mild shortness of breath during these episodes.   He has never taken SL nitro for this, as it caused him severe headaches in the past.  He has a hiatal hernia and had an EGD "several years ago".  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   Allergies  Allergen Reactions  . Codeine Nausea And Vomiting    Current Outpatient Prescriptions  Medication Sig Dispense Refill  . amLODipine (NORVASC) 10 MG tablet Take 1 tablet (10 mg total) by mouth daily.  90 tablet  3  . Ascorbic Acid (VITAMIN C) 1000 MG tablet Take 1,000 mg by mouth daily.      Marland Kitchen aspirin 81 MG tablet Take 81 mg by mouth daily.      . Coenzyme Q10 (CO Q 10) 100 MG CAPS Take 100 mg by mouth daily.      . Flaxseed, Linseed, (FLAX SEED OIL) 1000 MG CAPS Take 1,000  mg by mouth daily.      Marland Kitchen lisinopril-hydrochlorothiazide (PRINZIDE,ZESTORETIC) 20-12.5 MG per tablet TAKE 2 TABLETS BY MOUTH ONCE DAILY.  60 tablet  1  . Lycopene 10 MG CAPS Take 10 mg by mouth daily.      . metoprolol succinate (TOPROL-XL) 100 MG 24 hr tablet Take 1 tablet (100 mg total) by mouth daily. Take with or immediately following a meal.  90 tablet  3  . Multiple Vitamin (MULTIVITAMIN) tablet Take 1 tablet by mouth daily.      . Omega-3 Fatty Acids (FISH OIL) 1200 MG CAPS Take 1,200 mg by mouth daily.      . pravastatin (PRAVACHOL) 80 MG tablet Take 1 tablet (80 mg total) by mouth every evening.  30 tablet  6  . saw palmetto 160 MG capsule Take 160 mg by mouth daily.      Marland Kitchen spironolactone (ALDACTONE) 25 MG tablet Take 1 tablet (25 mg total) by mouth daily.  90 tablet  3   No current facility-administered medications for this visit.    Past Medical History  Diagnosis Date  . Hypertension   . Hyperlipidemia   . Nephrolithiasis   . Chest pain, exertional     Past Surgical History  Procedure Laterality Date  . Hernia repair      X2    History   Social History  . Marital Status: Divorced    Spouse Name: N/A    Number of Children:  N/A  . Years of Education: N/A   Occupational History  . Not on file.   Social History Main Topics  . Smoking status: Current Every Day Smoker -- 0.80 packs/day for 15 years    Types: Cigarettes  . Smokeless tobacco: Not on file     Comment: Patient is trying electronic cigarette.  . Alcohol Use: Yes     Comment: Rare  . Drug Use: No  . Sexual Activity: Not on file   Other Topics Concern  . Not on file   Social History Narrative  . No narrative on file     Filed Vitals:   07/31/13 1010  BP: 118/70  Pulse: 69  Height: 5\' 9"  (1.753 m)  Weight: 219 lb (99.338 kg)    PHYSICAL EXAM General: NAD Neck: No JVD, no thyromegaly or thyroid nodule.  Lungs: Clear to auscultation bilaterally with normal respiratory effort. CV:  Nondisplaced PMI.  Heart regular S1/S2, no S3/S4, no murmur.  No peripheral edema.  No carotid bruit.  Normal pedal pulses.  Abdomen: Soft, nontender, no hepatosplenomegaly, no distention.  Neurologic: Alert and oriented x 3.  Psych: Normal affect. Extremities: No clubbing or cyanosis.   ECG: reviewed and available in electronic records.      ASSESSMENT AND PLAN: 1. 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 No changes in current therapy, which include ASA, Toprol-SL, and pravastatin. 2. HTN: controlled on current therapy, which includes amlodipine, spironolactone, and Prinzide. 3. Hyperlipidemia: on high-dose pravastatin. Will check lipids. 4. Tobacco abuse: I counseled him extensively on cessation.   Prentice Docker, M.D., F.A.C.C.

## 2013-08-10 NOTE — Telephone Encounter (Signed)
Noted pt had an apt on 07/31/13 w3ith Dr. Purvis Sheffield as advised

## 2014-04-12 NOTE — Progress Notes (Signed)
HPI: [name] is a 63 year old patient of Dr. Pennelope Bracken we are following for ongoing assessment and management of exertional chest pain, hypertension, hyperlipidemia, chronic kidney disease. He had a nuclear medicine stress test which demonstrated significant stress-induced ischemia with EKG abnormalities in the setting of baseline ST segment depression with no exercise-related chest discomfort. I think gripping imaging, there was a possible mild scarring at the base of the inferior wall without evidence for ischemia., The findings were suggestive of low risk for cardiovascular disease, in October of 2013.   He has since been seen on an annual basis, quit smoking in January of 2015 while being treated for the flu. Since that time, the patient states he has had worsening discomfort in his chest, and dyspnea on exertion even though he stopped tobacco abuse. Over the last month or so, the patient's symptoms have worsened with increased chest discomfort, radiation to both arms and heaviness, with exertion. Also, the patient is noticing it at rest while in bed keeping him awake. He has been taking aspirin. No nitroglycerin. He has had this in the past causing severe headaches. As a result of increasing symptoms he presents back to the office.   Allergies  Allergen Reactions  . Codeine Nausea And Vomiting    Current Outpatient Prescriptions  Medication Sig Dispense Refill  . amLODipine (NORVASC) 10 MG tablet Take 1 tablet (10 mg total) by mouth daily.  90 tablet  3  . Ascorbic Acid (VITAMIN C) 1000 MG tablet Take 1,000 mg by mouth daily.      Marland Kitchen aspirin 81 MG tablet Take 81 mg by mouth daily.      . Coenzyme Q10 (CO Q 10) 100 MG CAPS Take 100 mg by mouth daily.      . Flaxseed, Linseed, (FLAX SEED OIL) 1000 MG CAPS Take 1,000 mg by mouth daily.      Marland Kitchen lisinopril-hydrochlorothiazide (PRINZIDE,ZESTORETIC) 20-12.5 MG per tablet TAKE 2 TABLETS BY MOUTH ONCE DAILY.  60 tablet  11  . Lycopene 10 MG  CAPS Take 10 mg by mouth daily.      . metoprolol succinate (TOPROL-XL) 100 MG 24 hr tablet Take 1 tablet (100 mg total) by mouth daily. Take with or immediately following a meal.  90 tablet  3  . Multiple Vitamin (MULTIVITAMIN) tablet Take 1 tablet by mouth daily.      . Omega-3 Fatty Acids (FISH OIL) 1200 MG CAPS Take 1,200 mg by mouth daily.      . pravastatin (PRAVACHOL) 80 MG tablet Take 1 tablet (80 mg total) by mouth every evening.  30 tablet  11  . saw palmetto 160 MG capsule Take 160 mg by mouth daily.      Marland Kitchen spironolactone (ALDACTONE) 25 MG tablet Take 1 tablet (25 mg total) by mouth daily.  90 tablet  3   No current facility-administered medications for this visit.    Past Medical History  Diagnosis Date  . Hypertension   . Hyperlipidemia   . Nephrolithiasis   . Chest pain, exertional     Past Surgical History  Procedure Laterality Date  . Hernia repair      X2    Family History  Problem Relation Age of Onset  . Heart attack Mother 39    Cause of death  . Heart attack Father 60    Cause of death  . Hypertension Sister     History   Social History  . Marital Status: Divorced  Spouse Name: N/A    Number of Children: N/A  . Years of Education: N/A   Occupational History  . Not on file.   Social History Main Topics  . Smoking status: Former Smoker -- 0.80 packs/day for 15 years    Types: Cigarettes    Quit date: 10/01/2013  . Smokeless tobacco: Not on file     Comment: Patient is trying electronic cigarette.  . Alcohol Use: Yes     Comment: Rare  . Drug Use: No  . Sexual Activity: Not on file   Other Topics Concern  . Not on file   Social History Narrative  . No narrative on file    QTM:AUQJFH of systems complete and found to be negative unless listed above  PHYSICAL EXAM BP 102/58  Pulse 84  Ht 5\' 9"  (1.753 m)  Wt 218 lb (98.884 kg)  BMI 32.18 kg/m2  General: Well developed, well nourished, in no acute distress Head: Eyes PERRLA, No  xanthomas.   Normal cephalic and atramatic  Lungs: Clear bilaterally to auscultation and percussion. Heart: HRRR S1 S2, without MRG.  Pulses are 2+ & equal.            No carotid bruit. No JVD.  No abdominal bruits. No femoral bruits. Abdomen: Bowel sounds are positive, abdomen soft and non-tender without masses or                  Hernia's noted. Msk:  Back normal, normal gait. Normal strength and tone for age. Pain is not reproducible with movement. Extremities: No clubbing, cyanosis or edema.  DP +1 Neuro: Alert and oriented X 3. Psych:  Good affect, responds appropriately  EKG: Normal sinus rhythm rate of 79 beats per min  ASSESSMENT AND PLAN

## 2014-04-13 ENCOUNTER — Ambulatory Visit (INDEPENDENT_AMBULATORY_CARE_PROVIDER_SITE_OTHER): Payer: Self-pay | Admitting: Adult Health

## 2014-04-13 ENCOUNTER — Encounter: Payer: Self-pay | Admitting: Adult Health

## 2014-04-13 ENCOUNTER — Encounter: Payer: Self-pay | Admitting: *Deleted

## 2014-04-13 ENCOUNTER — Ambulatory Visit (HOSPITAL_COMMUNITY)
Admission: RE | Admit: 2014-04-13 | Discharge: 2014-04-13 | Disposition: A | Discharge status: Home / Self Care | Payer: Self-pay | Source: Ambulatory Visit | Attending: Adult Health | Admitting: Adult Health

## 2014-04-13 VITALS — BP 102/58 | HR 84 | Ht 69.0 in | Wt 218.0 lb

## 2014-04-13 DIAGNOSIS — N182 Chronic kidney disease, stage 2 (mild): Secondary | ICD-10-CM

## 2014-04-13 DIAGNOSIS — Z01812 Encounter for preprocedural laboratory examination: Secondary | ICD-10-CM

## 2014-04-13 DIAGNOSIS — I10 Essential (primary) hypertension: Secondary | ICD-10-CM | POA: Insufficient documentation

## 2014-04-13 DIAGNOSIS — R079 Chest pain, unspecified: Secondary | ICD-10-CM | POA: Insufficient documentation

## 2014-04-13 DIAGNOSIS — I517 Cardiomegaly: Secondary | ICD-10-CM | POA: Insufficient documentation

## 2014-04-13 NOTE — Assessment & Plan Note (Signed)
[place] labs will be completed. The patient is on spironolactone, he has been placed on this as a result of hypokalemia chronically, and was taken off a potassium supplement by Dr. Lattie Haw in the past. May need to remove spironolactone should his kidney function continued to be an issue and restart potassium supplements.

## 2014-04-13 NOTE — Patient Instructions (Signed)
Your physician recommends that you schedule a follow-up appointment in: after procedure  Your physician has requested that you have a cardiac catheterization. Cardiac catheterization is used to diagnose and/or treat various heart conditions. Doctors may recommend this procedure for a number of different reasons. The most common reason is to evaluate chest pain. Chest pain can be a symptom of coronary artery disease (CAD), and cardiac catheterization can show whether plaque is narrowing or blocking your heart's arteries. This procedure is also used to evaluate the valves, as well as measure the blood flow and oxygen levels in different parts of your heart. For further information please visit [email or web address]. Please follow instruction sheet, as given.  Your physician recommends that you return for lab work on Monday. BMET, CBC  A chest x-ray takes a picture of the organs and structures inside the chest, including the heart, lungs, and blood vessels. This test can show several things, including, whether the heart is enlarges; whether fluid is building up in the lungs; and whether pacemaker / defibrillator leads are still in place.

## 2014-04-13 NOTE — Progress Notes (Deleted)
Name: Jeremiah Calhoun    DOB: [birth year]  Age: 63 y.o.  MR#: [id number]       PCP:  Lanette Hampshire, MD      Insurance: Payor: / No coverage found.  CC:    Chief Complaint  Patient presents with  . Coronary Artery Disease  . Hypertension  . Hyperlipidemia    VS Filed Vitals:   04/13/14 1411  BP: 102/58  Pulse: 84  Height: 5\' 9"  (1.753 m)  Weight: 218 lb (98.884 kg)    Weights Current Weight  04/13/14 218 lb (98.884 kg)  07/31/13 219 lb (99.338 kg)  11/28/12 221 lb 6.4 oz (100.426 kg)    Blood Pressure  BP Readings from Last 3 Encounters:  04/13/14 102/58  07/31/13 118/70  11/28/12 158/98     Admit date:  (Not on file) Last encounter with RMR:  Visit date not found   Allergy Codeine  Current Outpatient Prescriptions  Medication Sig Dispense Refill  . amLODipine (NORVASC) 10 MG tablet Take 1 tablet (10 mg total) by mouth daily.  90 tablet  3  . Ascorbic Acid (VITAMIN C) 1000 MG tablet Take 1,000 mg by mouth daily.      Marland Kitchen aspirin 81 MG tablet Take 81 mg by mouth daily.      . Coenzyme Q10 (CO Q 10) 100 MG CAPS Take 100 mg by mouth daily.      . Flaxseed, Linseed, (FLAX SEED OIL) 1000 MG CAPS Take 1,000 mg by mouth daily.      Marland Kitchen lisinopril-hydrochlorothiazide (PRINZIDE,ZESTORETIC) 20-12.5 MG per tablet TAKE 2 TABLETS BY MOUTH ONCE DAILY.  60 tablet  11  . Lycopene 10 MG CAPS Take 10 mg by mouth daily.      . metoprolol succinate (TOPROL-XL) 100 MG 24 hr tablet Take 1 tablet (100 mg total) by mouth daily. Take with or immediately following a meal.  90 tablet  3  . Multiple Vitamin (MULTIVITAMIN) tablet Take 1 tablet by mouth daily.      . Omega-3 Fatty Acids (FISH OIL) 1200 MG CAPS Take 1,200 mg by mouth daily.      . pravastatin (PRAVACHOL) 80 MG tablet Take 1 tablet (80 mg total) by mouth every evening.  30 tablet  11  . saw palmetto 160 MG capsule Take 160 mg by mouth daily.      Marland Kitchen spironolactone (ALDACTONE) 25 MG tablet Take 1 tablet (25 mg total) by mouth daily.  90  tablet  3   No current facility-administered medications for this visit.    Discontinued Meds:   There are no discontinued medications.  Patient Active Problem List   Diagnosis Date Noted  . 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 Chest pain, exertional   . Hyperlipidemia   . Hypertension     LABS    Component Value Date/Time   NA 140 09/03/2012 0940   NA 137 07/23/2012 0850   NA 141 07/09/2012 1143   K 4.4 09/03/2012 0940   K 3.2* 07/23/2012 0850   K 3.6 07/09/2012 1143   CL 105 09/03/2012 0940   CL 98 07/23/2012 0850   CL 99 07/09/2012 1143   CO2 27 09/03/2012 0940   CO2 30 07/23/2012 0850   CO2 32 07/09/2012 1143   GLUCOSE 99 09/03/2012 0940   GLUCOSE 95 07/23/2012 0850   GLUCOSE 81 07/09/2012 1143   BUN 22 09/03/2012 0940   BUN 21 07/23/2012 0850   BUN 22 07/09/2012 1143  CREATININE 1.34 09/03/2012 0940   CREATININE 1.53* 07/23/2012 0850   CREATININE 1.43* 07/09/2012 1143   CALCIUM 9.6 09/03/2012 0940   CALCIUM 9.3 07/23/2012 0850   CALCIUM 9.7 07/09/2012 1143   CMP     Component Value Date/Time   NA 140 09/03/2012 0940   K 4.4 09/03/2012 0940   CL 105 09/03/2012 0940   CO2 27 09/03/2012 0940   GLUCOSE 99 09/03/2012 0940   BUN 22 09/03/2012 0940   CREATININE 1.34 09/03/2012 0940   CALCIUM 9.6 09/03/2012 0940    No results found for this basename: wbc, hgb, hct, mcv, platelets    Lipid Panel     Component Value Date/Time   CHOL 138 10/17/2012 1011   TRIG 155* 10/17/2012 1011   HDL 35* 10/17/2012 1011   CHOLHDL 3.9 10/17/2012 1011   VLDL 31 10/17/2012 1011   LDLCALC 72 10/17/2012 1011    ABG No results found for this basename: phart, pco2, pco2art, po2, po2art, hco3, tco2, acidbasedef, o2sat     No results found for this basename: TSH   BNP (last 3 results) No results found for this basename: PROBNP,  in the last 8760 hours Cardiac Panel (last 3 results) No results found for this basename: CKTOTAL, CKMB, TROPONINI, RELINDX,  in the last 72 hours   Iron/TIBC/Ferritin/ %Sat No results found for this basename: iron, tibc, ferritin, ironpctsat     EKG Orders placed in visit on 04/13/14  . EKG 12-LEAD     Prior Assessment and Plan Problem List as of 04/13/2014     Cardiovascular and Mediastinum   Hypertension   Last Assessment & Plan   11/28/2012 Office Visit Written 11/29/2012  4:38 PM by Yehuda Savannah, MD     Blood pressure control is suboptimal, but exact medical regimen is unclear. We will contact patient's pharmacy and adjust therapy based upon the information received.      Nervous and Auditory   Hyperlipidemia   Last Assessment & Plan   11/28/2012 Office Visit Written 11/29/2012  4:37 PM by Yehuda Savannah, MD     Recent lipid profiles are excellent with current therapy, which will be continued.      Genitourinary   Chronic kidney disease, stage II (mild)   Last Assessment & Plan   11/28/2012 Office Visit Written 11/29/2012  4:36 PM by Yehuda Savannah, MD     Creatinine is lower than on determinations a few months ago indicating that renal disease is stable to improved and remains mild.      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 Continued medical treatment and optimal control of cardiovascular risk factors are appropriate.        Imaging: No results found.

## 2014-04-13 NOTE — Assessment & Plan Note (Signed)
The patient has had worsening symptoms of chest pressure, usually occurring with exertion, with bilateral arm heaviness, and worsening shortness of breath. This is being worsening since January, since he stopped smoking and recovered from the flu. However the symptoms have become worse over the last month. He states he cannot even take the garbage out without having to stop due to pressure in his chest and shortness of breath.  He had a low risk Cardiolite stress test in October of 2013, he did have some baseline ST segment depression and no exercise-related chest discomfort at that time scintigraphic imaging revealed possible mild scarring at the base of the inferior wall without evidence of ischemia. At that time he was deemed a low risk study.  I discussed the patient's case with Dr. Pennelope Bracken, including his symptoms and stress Myoview. It is our recommendation that patient have cardiac catheterization for definitive evaluation of coronary anatomy, with possible PCI based upon results of angiography. Risks, benefits, and description of the procedure were completed with the patient who verbalizes understanding and is willing to proceed. He is planned for July 23 with Dr. Burt Knack.

## 2014-04-13 NOTE — Assessment & Plan Note (Signed)
Blood pressure is low normal today at 102/58, this was rechecked and found to be consistent with original blood pressure reading. I will decrease his amlodipine to 5 mg daily to avoid hypotension. Medication adjustments can be made after cardiac catheterization depending upon findings.

## 2014-04-16 ENCOUNTER — Inpatient Hospital Stay (HOSPITAL_COMMUNITY)
Admission: EM | Admit: 2014-04-16 | Discharge: 2014-04-20 | DRG: 378 | Disposition: A | Discharge status: Home / Self Care | Payer: Self-pay | Attending: Family Medicine | Admitting: Family Medicine

## 2014-04-16 ENCOUNTER — Emergency Department (HOSPITAL_COMMUNITY): Payer: Self-pay

## 2014-04-16 ENCOUNTER — Encounter (HOSPITAL_COMMUNITY): Payer: Self-pay | Admitting: Emergency Medicine

## 2014-04-16 DIAGNOSIS — D649 Anemia, unspecified: Secondary | ICD-10-CM | POA: Diagnosis present

## 2014-04-16 DIAGNOSIS — N183 Chronic kidney disease, stage 3 unspecified: Secondary | ICD-10-CM | POA: Diagnosis present

## 2014-04-16 DIAGNOSIS — K922 Gastrointestinal hemorrhage, unspecified: Principal | ICD-10-CM | POA: Diagnosis present

## 2014-04-16 DIAGNOSIS — I2510 Atherosclerotic heart disease of native coronary artery without angina pectoris: Secondary | ICD-10-CM | POA: Diagnosis present

## 2014-04-16 DIAGNOSIS — Z8249 Family history of ischemic heart disease and other diseases of the circulatory system: Secondary | ICD-10-CM

## 2014-04-16 DIAGNOSIS — I10 Essential (primary) hypertension: Secondary | ICD-10-CM

## 2014-04-16 DIAGNOSIS — D500 Iron deficiency anemia secondary to blood loss (chronic): Secondary | ICD-10-CM | POA: Diagnosis present

## 2014-04-16 DIAGNOSIS — I440 Atrioventricular block, first degree: Secondary | ICD-10-CM | POA: Diagnosis present

## 2014-04-16 DIAGNOSIS — K449 Diaphragmatic hernia without obstruction or gangrene: Secondary | ICD-10-CM | POA: Diagnosis present

## 2014-04-16 DIAGNOSIS — Z860100 Personal history of colon polyps, unspecified: Secondary | ICD-10-CM

## 2014-04-16 DIAGNOSIS — I252 Old myocardial infarction: Secondary | ICD-10-CM

## 2014-04-16 DIAGNOSIS — Z87891 Personal history of nicotine dependence: Secondary | ICD-10-CM

## 2014-04-16 DIAGNOSIS — K228 Other specified diseases of esophagus: Secondary | ICD-10-CM | POA: Diagnosis present

## 2014-04-16 DIAGNOSIS — N19 Unspecified kidney failure: Secondary | ICD-10-CM

## 2014-04-16 DIAGNOSIS — I959 Hypotension, unspecified: Secondary | ICD-10-CM | POA: Diagnosis present

## 2014-04-16 DIAGNOSIS — R079 Chest pain, unspecified: Secondary | ICD-10-CM

## 2014-04-16 DIAGNOSIS — N182 Chronic kidney disease, stage 2 (mild): Secondary | ICD-10-CM

## 2014-04-16 DIAGNOSIS — Z01812 Encounter for preprocedural laboratory examination: Secondary | ICD-10-CM

## 2014-04-16 DIAGNOSIS — K2100 Gastro-esophageal reflux disease with esophagitis, without bleeding: Secondary | ICD-10-CM | POA: Diagnosis present

## 2014-04-16 DIAGNOSIS — E785 Hyperlipidemia, unspecified: Secondary | ICD-10-CM | POA: Diagnosis present

## 2014-04-16 DIAGNOSIS — K5730 Diverticulosis of large intestine without perforation or abscess without bleeding: Secondary | ICD-10-CM | POA: Diagnosis present

## 2014-04-16 DIAGNOSIS — K222 Esophageal obstruction: Secondary | ICD-10-CM | POA: Diagnosis present

## 2014-04-16 DIAGNOSIS — I129 Hypertensive chronic kidney disease with stage 1 through stage 4 chronic kidney disease, or unspecified chronic kidney disease: Secondary | ICD-10-CM | POA: Diagnosis present

## 2014-04-16 DIAGNOSIS — K2289 Other specified disease of esophagus: Secondary | ICD-10-CM | POA: Diagnosis present

## 2014-04-16 DIAGNOSIS — K2960 Other gastritis without bleeding: Secondary | ICD-10-CM | POA: Diagnosis present

## 2014-04-16 DIAGNOSIS — N179 Acute kidney failure, unspecified: Secondary | ICD-10-CM | POA: Diagnosis present

## 2014-04-16 DIAGNOSIS — K648 Other hemorrhoids: Secondary | ICD-10-CM | POA: Diagnosis present

## 2014-04-16 DIAGNOSIS — D508 Other iron deficiency anemias: Secondary | ICD-10-CM

## 2014-04-16 HISTORY — DX: Chronic kidney disease, unspecified: N18.9

## 2014-04-16 LAB — CBC
HCT: 18.2 % — ABNORMAL LOW (ref 39.0–52.0)
HCT: 18.3 % — ABNORMAL LOW (ref 39.0–52.0)
Hemoglobin: 5.1 g/dL — CL (ref 13.0–17.0)
Hemoglobin: 5.2 g/dL — CL (ref 13.0–17.0)
MCH: 18.6 pg — ABNORMAL LOW (ref 26.0–34.0)
MCH: 18.6 pg — ABNORMAL LOW (ref 26.0–34.0)
MCHC: 28 g/dL — ABNORMAL LOW (ref 30.0–36.0)
MCHC: 28.4 g/dL — ABNORMAL LOW (ref 30.0–36.0)
MCV: 66.4 fL — ABNORMAL LOW (ref 78.0–100.0)
MCV: 65.6 fL — ABNORMAL LOW (ref 78.0–100.0)
Platelets: 322 10*3/uL (ref 150–400)
Platelets: 334 10*3/uL (ref 150–400)
RBC: 2.74 MIL/uL — ABNORMAL LOW (ref 4.22–5.81)
RBC: 2.79 MIL/uL — ABNORMAL LOW (ref 4.22–5.81)
RDW: 18.4 % — ABNORMAL HIGH (ref 11.5–15.5)
RDW: 18.3 % — ABNORMAL HIGH (ref 11.5–15.5)
WBC: 6.1 10*3/uL (ref 4.0–10.5)
WBC: 6.2 10*3/uL (ref 4.0–10.5)

## 2014-04-16 LAB — HEPATIC FUNCTION PANEL
ALT: 13 U/L (ref 0–53)
AST: 14 U/L (ref 0–37)
Albumin: 3.4 g/dL — ABNORMAL LOW (ref 3.5–5.2)
Alkaline Phosphatase: 58 U/L (ref 39–117)
Bilirubin, Direct: <0.2 mg/dL (ref 0.0–0.3)
Total Bilirubin: 0.2 mg/dL — ABNORMAL LOW (ref 0.3–1.2)
Total Protein: 6.3 g/dL (ref 6.0–8.3)

## 2014-04-16 LAB — POC OCCULT BLOOD, ED: Fecal Occult Bld: POSITIVE — AB

## 2014-04-16 LAB — BASIC METABOLIC PANEL
Anion gap: 14 (ref 5–15)
BUN: 58 mg/dL — ABNORMAL HIGH (ref 6–23)
CO2: 23 mEq/L (ref 19–32)
Calcium: 9.3 mg/dL (ref 8.4–10.5)
Chloride: 100 mEq/L (ref 96–112)
Creatinine, Ser: 3.19 mg/dL — ABNORMAL HIGH (ref 0.50–1.35)
GFR calc Af Amer: 22 mL/min — ABNORMAL LOW (ref >90)
GFR calc non Af Amer: 19 mL/min — ABNORMAL LOW (ref >90)
Glucose, Bld: 101 mg/dL — ABNORMAL HIGH (ref 70–99)
Potassium: 4.8 mEq/L (ref 3.7–5.3)
Sodium: 137 mEq/L (ref 137–147)

## 2014-04-16 LAB — ABO/RH: ABO/RH(D): O POS

## 2014-04-16 LAB — PREPARE RBC (CROSSMATCH)

## 2014-04-16 LAB — TROPONIN I: Troponin I: <0.3 ng/mL (ref <0.30)

## 2014-04-16 MED ORDER — HEPARIN SODIUM (PORCINE) 5000 UNIT/ML IJ SOLN
5000.0000 [IU] | Freq: Three times a day (TID) | INTRAMUSCULAR | Status: DC
  Administered 2014-04-17: 5000 [IU] via SUBCUTANEOUS
  Filled 2014-04-16: qty 1

## 2014-04-16 MED ORDER — ALBUTEROL SULFATE HFA 108 (90 BASE) MCG/ACT IN AERS
2.0000 | INHALATION_SPRAY | Freq: Four times a day (QID) | RESPIRATORY_TRACT | Status: DC
Start: 2014-04-16 — End: 2014-04-16

## 2014-04-16 MED ORDER — DM-GUAIFENESIN ER 30-600 MG PO TB12: 1.0000 | ORAL_TABLET | Freq: Two times a day (BID) | ORAL | Status: DC

## 2014-04-16 MED ORDER — ASPIRIN 81 MG PO CHEW: 81.0000 mg | CHEWABLE_TABLET | ORAL | Status: AC

## 2014-04-16 MED ORDER — HEPARIN SODIUM (PORCINE) 5000 UNIT/ML IJ SOLN: 5000.0000 [IU] | Freq: Three times a day (TID) | INTRAMUSCULAR | Status: DC

## 2014-04-16 MED ORDER — ASPIRIN EC 81 MG PO TBEC
81.0000 mg | DELAYED_RELEASE_TABLET | Freq: Every day | ORAL | Status: DC
  Administered 2014-04-18 – 2014-04-20 (×3): 81 mg via ORAL
  Filled 2014-04-16 (×3): qty 1

## 2014-04-16 MED ORDER — ACETAMINOPHEN 325 MG PO TABS: 650.0000 mg | ORAL_TABLET | ORAL | Status: DC | PRN

## 2014-04-16 MED ORDER — SODIUM CHLORIDE 0.45 % IV SOLN
INTRAVENOUS | Status: DC
  Administered 2014-04-17 – 2014-04-19 (×5): via INTRAVENOUS

## 2014-04-16 MED ORDER — SODIUM CHLORIDE 0.9 % IV SOLN: 250.0000 mL | INTRAVENOUS | Status: DC | PRN

## 2014-04-16 MED ORDER — SIMVASTATIN 20 MG PO TABS
40.0000 mg | ORAL_TABLET | Freq: Every day | ORAL | Status: DC
  Administered 2014-04-17 – 2014-04-19 (×4): 40 mg via ORAL
  Filled 2014-04-16 (×4): qty 2

## 2014-04-16 MED ORDER — CO Q 10 100 MG PO CAPS: 100.0000 mg | ORAL_CAPSULE | Freq: Every day | ORAL | Status: DC

## 2014-04-16 MED ORDER — SODIUM CHLORIDE 0.9 % IV SOLN: INTRAVENOUS | Status: DC

## 2014-04-16 MED ORDER — SODIUM CHLORIDE 0.9 % IV BOLUS (SEPSIS)
1000.0000 mL | Freq: Once | INTRAVENOUS | Status: AC
  Administered 2014-04-16: 1000 mL via INTRAVENOUS

## 2014-04-16 MED ORDER — AZITHROMYCIN 250 MG PO TABS: 250.0000 mg | ORAL_TABLET | Freq: Every day | ORAL | Status: DC

## 2014-04-16 MED ORDER — SODIUM CHLORIDE 0.9 % IV SOLN: INTRAVENOUS | Status: AC

## 2014-04-16 MED ORDER — SODIUM CHLORIDE 0.9 % IJ SOLN
3.0000 mL | Freq: Two times a day (BID) | INTRAMUSCULAR | Status: DC
  Administered 2014-04-17 – 2014-04-18 (×2): 3 mL via INTRAVENOUS

## 2014-04-16 MED ORDER — SODIUM CHLORIDE 0.9 % IJ SOLN: 3.0000 mL | INTRAMUSCULAR | Status: DC | PRN

## 2014-04-16 MED ORDER — GI COCKTAIL ~~LOC~~: 30.0000 mL | Freq: Four times a day (QID) | ORAL | Status: DC | PRN

## 2014-04-16 MED ORDER — ONDANSETRON HCL 4 MG/2ML IJ SOLN: 4.0000 mg | Freq: Four times a day (QID) | INTRAMUSCULAR | Status: DC | PRN

## 2014-04-16 MED ORDER — SODIUM CHLORIDE 0.9 % IV SOLN
510.0000 mg | Freq: Once | INTRAVENOUS | Status: AC
  Administered 2014-04-17: 510 mg via INTRAVENOUS
  Filled 2014-04-16: qty 17

## 2014-04-16 NOTE — ED Notes (Signed)
Intermittent cp for a few months with cardiac cath scheduled for next week.  Reports was walking up steps earlier when cp began again to both sides of chest and radiating down both arms.  Pt took on ASA prior to EMS arrival.  Pt denies cp at this time, denies sob/n/v/dizziness.

## 2014-04-16 NOTE — ED Provider Notes (Signed)
CSN: [id number]     Arrival date & time 04/16/14  1818 History   First MD Initiated Contact with Patient 04/16/14 1821     Chief Complaint  Patient presents with  . Chest Pain     (Consider location/radiation/quality/duration/timing/severity/associated sxs/prior Treatment) Patient is a 63 y.o. male presenting with chest pain. The history is provided by the patient.  Chest Pain Associated symptoms: fatigue and shortness of breath   Associated symptoms: no abdominal pain, no back pain, no fever, no headache, no nausea, no palpitations and not vomiting    patient with several month history of exertional chest pain and shortness of breath. Patient had an episode today at 5:30 last him 30 minutes left substernal to left arm very similar to past but lasted longer. Patient does not have nitroglycerin at home. Patient does have aspirin does take aspirin. Patient followed by Dr. Emilee Hero. Patient scheduled for cardiac cath next week through [facility].  Past Medical History  Diagnosis Date  . Hypertension   . Hyperlipidemia   . Nephrolithiasis   . Chest pain, exertional    Past Surgical History  Procedure Laterality Date  . Hernia repair      X2   Family History  Problem Relation Age of Onset  . Heart attack Mother 58    Cause of death  . Heart attack Father 24    Cause of death  . Hypertension Sister    History  Substance Use Topics  . Smoking status: Former Smoker -- 0.80 packs/day for 15 years    Types: Cigarettes    Quit date: 10/01/2013  . Smokeless tobacco: Not on file     Comment: Patient is trying electronic cigarette.  . Alcohol Use: Yes     Comment: Rare    Review of Systems  Constitutional: Positive for fatigue. Negative for fever.  HENT: Negative for congestion.   Eyes: Negative for visual disturbance.  Respiratory: Positive for shortness of breath.   Cardiovascular: Positive for chest pain. Negative for palpitations.  Gastrointestinal: Negative for nausea,  vomiting, abdominal pain and blood in stool.  Genitourinary: Negative for hematuria.  Musculoskeletal: Negative for back pain.  Skin: Positive for pallor. Negative for rash.  Neurological: Positive for light-headedness. Negative for headaches.  Hematological: Does not bruise/bleed easily.  Psychiatric/Behavioral: Negative for confusion.      Allergies  Codeine  Home Medications   Prior to Admission medications   Medication Sig Start Date End Date Taking? Authorizing Provider  amLODipine (NORVASC) 10 MG tablet Take 5 mg by mouth daily.   Yes Historical Provider, MD  Ascorbic Acid (VITAMIN C) 1000 MG tablet Take 1,000 mg by mouth daily.   Yes Historical Provider, MD  aspirin EC 325 MG tablet Take 325 mg by mouth once as needed (for chest pain).   Yes Historical Provider, MD  aspirin EC 81 MG tablet Take 81 mg by mouth daily.   Yes Historical Provider, MD  Coenzyme Q10 (CO Q 10) 100 MG CAPS Take 100 mg by mouth daily.   Yes Historical Provider, MD  Flaxseed, Linseed, (FLAX SEED OIL) 1000 MG CAPS Take 1,000 mg by mouth daily.   Yes Historical Provider, MD  lisinopril-hydrochlorothiazide (PRINZIDE,ZESTORETIC) 20-12.5 MG per tablet TAKE 2 TABLETS BY MOUTH ONCE DAILY. 07/31/13  Yes Herminio Commons, MD  lisinopril-hydrochlorothiazide (PRINZIDE,ZESTORETIC) 20-12.5 MG per tablet Take 2 tablets by mouth daily.   Yes Historical Provider, MD  Lycopene 10 MG CAPS Take 10 mg by mouth daily.   Yes  Historical Provider, MD  metoprolol succinate (TOPROL-XL) 100 MG 24 hr tablet Take 1 tablet (100 mg total) by mouth daily. Take with or immediately following a meal. 07/31/13  Yes Herminio Commons, MD  Multiple Vitamin (MULTIVITAMIN) tablet Take 1 tablet by mouth daily.   Yes Historical Provider, MD  Omega-3 Fatty Acids (FISH OIL) 1200 MG CAPS Take 1,200 mg by mouth daily.   Yes Historical Provider, MD  pravastatin (PRAVACHOL) 80 MG tablet Take 1 tablet (80 mg total) by mouth every evening. 07/31/13   Yes Herminio Commons, MD  saw palmetto 160 MG capsule Take 160 mg by mouth daily.   Yes Historical Provider, MD  spironolactone (ALDACTONE) 25 MG tablet Take 1 tablet (25 mg total) by mouth daily. 07/31/13  Yes Herminio Commons, MD   BP 101/60  Pulse 103  Temp(Src) 98 F (36.7 C) (Oral)  Resp 15  Ht 5\' 9"  (1.753 m)  Wt 209 lb (94.802 kg)  BMI 30.85 kg/m2  SpO2 99% Physical Exam  Nursing note and vitals reviewed. Constitutional: He is oriented to person, place, and time. He appears well-developed and well-nourished. No distress.  HENT:  Head: Normocephalic and atraumatic.  Mouth/Throat: Oropharynx is clear and moist.  Eyes: Conjunctivae and EOM are normal. Pupils are equal, round, and reactive to light.  Sclerae pale. Not icteric.  Neck: Normal range of motion. Neck supple.  Cardiovascular: Normal rate, regular rhythm and normal heart sounds.   No murmur heard. Pulmonary/Chest: Effort normal and breath sounds normal. No respiratory distress.  Abdominal: Soft. Bowel sounds are normal. There is no tenderness.  Musculoskeletal: Normal range of motion. He exhibits no edema.  Neurological: He is alert and oriented to person, place, and time. No cranial nerve deficit. He exhibits normal muscle tone. Coordination normal.  Skin: Skin is warm. No rash noted. No pallor.    ED Course  Procedures (including critical care time) Labs Review Labs Reviewed  CBC - Abnormal; Notable for the following:    RBC 2.74 (*)    Hemoglobin 5.1 (*)    HCT 18.2 (*)    MCV 66.4 (*)    MCH 18.6 (*)    MCHC 28.0 (*)    RDW 18.4 (*)    All other components within normal limits  BASIC METABOLIC PANEL - Abnormal; Notable for the following:    Glucose, Bld 101 (*)    BUN 58 (*)    Creatinine, Ser 3.19 (*)    GFR calc non Af Amer 19 (*)    GFR calc Af Amer 22 (*)    All other components within normal limits  TROPONIN I  HEPATIC FUNCTION PANEL  TYPE AND SCREEN  PREPARE RBC (CROSSMATCH)    Results for orders placed during the hospital encounter of 04/16/14  CBC      Result Value Ref Range   WBC 6.1  4.0 - 10.5 K/uL   RBC 2.74 (*) 4.22 - 5.81 MIL/uL   Hemoglobin 5.1 (*) 13.0 - 17.0 g/dL   HCT 18.2 (*) 39.0 - 52.0 %   MCV 66.4 (*) 78.0 - 100.0 fL   MCH 18.6 (*) 26.0 - 34.0 pg   MCHC 28.0 (*) 30.0 - 36.0 g/dL   RDW 18.4 (*) 11.5 - 15.5 %   Platelets 322  150 - 400 K/uL  BASIC METABOLIC PANEL      Result Value Ref Range   Sodium 137  137 - 147 mEq/L   Potassium 4.8  3.7 - 5.3  mEq/L   Chloride 100  96 - 112 mEq/L   CO2 23  19 - 32 mEq/L   Glucose, Bld 101 (*) 70 - 99 mg/dL   BUN 58 (*) 6 - 23 mg/dL   Creatinine, Ser 3.19 (*) 0.50 - 1.35 mg/dL   Calcium 9.3  8.4 - 10.5 mg/dL   GFR calc non Af Amer 19 (*) >90 mL/min   GFR calc Af Amer 22 (*) >90 mL/min   Anion gap 14  5 - 15  TROPONIN I      Result Value Ref Range   Troponin I <0.30  <0.30 ng/mL     Imaging Review Dg Chest Port 1 View  04/16/2014   CLINICAL DATA:  Chest pain. History of myocardial infarction. Mild chronic renal insufficiency.  EXAM: PORTABLE CHEST - 1 VIEW  COMPARISON:  04/13/2014  FINDINGS: Borderline cardiomegaly. No edema. Moderate-sized hiatal hernia. No pleural effusion.  The lungs appear clear.  IMPRESSION: 1. Moderate-sized hiatal hernia. 2. Borderline cardiomegaly.   Electronically Signed   By: Sherryl Barters M.D.   On: 04/16/2014 18:45     EKG Interpretation   Date/Time:  Friday April 16 2014 18:23:15 EDT Ventricular Rate:  91 PR Interval:  184 QRS Duration: 97 QT Interval:  341 QTC Calculation: 419 R Axis:   15 Text Interpretation:  Sinus rhythm Low voltage, precordial leads Minimal  ST depression, inferior leads Confirmed by Beena Catano  MD, Junella Domke [phone]) on  04/16/2014 6:33:31 PM      CRITICAL CARE Performed by: Fredia Sorrow  ?  Total critical care time: 30  Critical care time was exclusive of separately billable procedures and treating other patients.  Critical  care was necessary to treat or prevent imminent or life-threatening deterioration.  Critical care was time spent personally by me on the following activities: development of treatment plan with patient and/or surrogate as well as nursing, discussions with consultants, evaluation of patient's response to treatment, examination of patient, obtaining history from patient or surrogate, ordering and performing treatments and interventions, ordering and review of laboratory studies, ordering and review of radiographic studies, pulse oximetry and re-evaluation of patient's condition.  MDM   Final diagnoses:  Anemia, unspecified anemia type  Renal failure    Patient presenting with several month history of of intermittent chest pain today had 30 minute episode at about 5:30 in the evening. Chest pain was left substernal radiated to left arm. Patient's been having exertional chest pain now for a while is cardiac cath scheduled the for next week. Patient does not have nitroglycerin at home. Has been taking aspirin seems to help the pain. Pain is usually worse at night. Patient is followed by [facility] cardiology and Dr. Emilee Hero. Workup here shows a significant anemia which her lites are remarkable for significant renal insufficiency or failure BUN of 58 creatinine of 3.19 otherwise electrolytes are normal. No evidence of acidosis. No leukopenia or leukocytosis. But marked anemia with a hemoglobin of 5.1. Patient denies any blood in the bowel movements. Suspected this is the cause of the patient's chest pain is severe anemia. Will require admission. Patient typed and crossed for 2 units of blood to be transfused. First troponin negative EKG without acute findings. Will contact the hospitalist for admission. Do not think that this is a acute cardiac event. Patient's duration of exertional chest pain most likely related to the anemia.    Fredia Sorrow, MD 04/16/14 2019

## 2014-04-16 NOTE — ED Notes (Signed)
CRITICAL VALUE ALERT  Critical value received:  Hgb 5.1  Date of notification:  04/16/14  Time of notification:  1929  Critical value read back: yes  Nurse who received alert: Kayleen Memos, RN  MD notified:  Dr. Rogene Houston  Time:  1929

## 2014-04-16 NOTE — ED Notes (Signed)
To receive blood transfusion as per order. Type and screen have not resulted

## 2014-04-16 NOTE — H&P (Signed)
[facility] Admission History and Physical   Patient name: Jeremiah Calhoun Medical record number: [id number] Date of birth: 07-Nov-1950 Age: 63 y.o. Gender: male  Primary Care Provider: Lanette Hampshire, MD Consultants: cadriology Code Status: Full  Chief Complaint: chest pain  Assessment and Plan: Jeremiah Calhoun is a 63 y.o. male presenting with CP and profound anemia. PMH is significant for CKD II, exertional CP, Hiatal hernia, HTN, HLD.  CP: Cardiac vs reflux vs MSK vs pleuritic. Pt w/ significant risk factors and w/ apparent h/o silent MI. Initial troponin neg. EKG unremarable.  - Admit to Tele AP team 2 - cycle troponins (1/3 neg) - consider discussing w/ Cards team in the am if they want to transport to [facility] for Cath or wait until cath appt next week - Reflux as below - A1c - continue home statin (continue CoQ10 in conjunction w/ statin)  Anemia: profound microcytic anemia. Likely from gradual GI loss (Hemocult frankly positive). Upper GI vs lower GI. No h/o colonoscopy or endoscopy. Unlikely from chronic disease, autoimmune process. Renal disease and poor production as well as iron deficiency may also be contributing. Unlikely lab error as repeat CBC confirmed.  - anemia panel - Type and cross / Transfuse 2 units - consider imaging vs coloscopy (inpt vs outpt) - IV iron x 1  HTN: Hypotensive on presentation. Tachy. Pt and girlfriend report hypertension that is dificult to control often in the systolic 833+ range.  - 1L NS bolus (pt tachy and hypotensive) - hold home metoprolol,  lisinopril and HCTZ and spironolactone.   AoC CKD II: Cr on presentation 3.19. Baseline around 1.4. Last Cr 56mo ago. Deterioration likely from chronic HTN w/ acute worsening from anemia and poor perfusion.  - Hydration as above - BMET in the am  GERD: worsened by hiatal hernia.  - start Protonix  FEN/GI: tolerating orals - heart healthy diet  Prophylaxis: Hep [place] TID  Disposition:  pending improvement in anemia.   History of Present Illness: Jeremiah Calhoun is a 63 y.o. male presenting with CP. Started 5:30. Achy in nature. Radiation to both arm. Onset after walking up stairs adn across parking lot. Ongoing issue for patient for several months. Cardiologist has pt scheduled for catheterization next week. Denies diaphoresis, lightheadedness, palpitations, nausea, vomiting, syncope. Tried XOV291 w/o benefit. Lasted around 30 min, but relieved w/ rest.   CP reproducible when laying down. Typically takes ASA at night.   H/o asymptomatic heart attack per pt per cardiologist.   Denies melanic stool, BRBPR. No colonoscopy. Quit smoking in January.   Review Of Systems: Per HPI with the following additions: none.  Otherwise 12 point review of systems was performed and was unremarkable.  Patient Active Problem List   Diagnosis Date Noted  . Anemia 04/16/2014  . 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 Chest pain, exertional   . Hyperlipidemia   . Hypertension    Past Medical History: Past Medical History  Diagnosis Date  . Hypertension   . Hyperlipidemia   . Nephrolithiasis   . Chest pain, exertional    Past Surgical History: Past Surgical History  Procedure Laterality Date  . Hernia repair      X2   Social History: History  Substance Use Topics  . Smoking status: Former Smoker -- 0.80 packs/day for 15 years    Types: Cigarettes    Quit date: 10/01/2013  . Smokeless tobacco: Not on file     Comment: Patient is trying  electronic cigarette.  . Alcohol Use: Yes     Comment: Rare   Additional social history: as above Please also refer to relevant sections of EMR.  Family History: Family History  Problem Relation Age of Onset  . Heart attack Mother 34    Cause of death  . Heart attack Father 41    Cause of death  . Hypertension Sister    Allergies and Medications: Allergies  Allergen Reactions  . Codeine Nausea And Vomiting   No  current facility-administered medications on file prior to encounter.   Current Outpatient Prescriptions on File Prior to Encounter  Medication Sig Dispense Refill  . Ascorbic Acid (VITAMIN C) 1000 MG tablet Take 1,000 mg by mouth daily.      . Coenzyme Q10 (CO Q 10) 100 MG CAPS Take 100 mg by mouth daily.      . Flaxseed, Linseed, (FLAX SEED OIL) 1000 MG CAPS Take 1,000 mg by mouth daily.      Marland Kitchen lisinopril-hydrochlorothiazide (PRINZIDE,ZESTORETIC) 20-12.5 MG per tablet TAKE 2 TABLETS BY MOUTH ONCE DAILY.  60 tablet  11  . Lycopene 10 MG CAPS Take 10 mg by mouth daily.      . metoprolol succinate (TOPROL-XL) 100 MG 24 hr tablet Take 1 tablet (100 mg total) by mouth daily. Take with or immediately following a meal.  90 tablet  3  . Multiple Vitamin (MULTIVITAMIN) tablet Take 1 tablet by mouth daily.      . Omega-3 Fatty Acids (FISH OIL) 1200 MG CAPS Take 1,200 mg by mouth daily.      . pravastatin (PRAVACHOL) 80 MG tablet Take 1 tablet (80 mg total) by mouth every evening.  30 tablet  11  . saw palmetto 160 MG capsule Take 160 mg by mouth daily.      Marland Kitchen spironolactone (ALDACTONE) 25 MG tablet Take 1 tablet (25 mg total) by mouth daily.  90 tablet  3    Objective: BP 101/60  Pulse 103  Temp(Src) 98 F (36.7 C) (Oral)  Resp 15  Ht 5\' 9"  (1.753 m)  Wt 94.802 kg (209 lb)  BMI 30.85 kg/m2  SpO2 99% Exam: General: NAD, wnwd HEENT: EOMI, mmm, perl Cardiovascular: RRR II/VI systolic murmur, 2+ pulses Respiratory: CTAB, nml effort Abdomen: NABS, soft, non-tender Extremities: 2+ pulses, 1+ pitting edema in LE Skin: intact, no rash Neuro: CN 2-12 grossly intact.   Labs and Imaging: Results for orders placed during the hospital encounter of 04/16/14 (from the past 24 hour(s))  CBC     Status: Abnormal   Collection Time    04/16/14  6:52 PM      Result Value Ref Range   WBC 6.1  4.0 - 10.5 K/uL   RBC 2.74 (*) 4.22 - 5.81 MIL/uL   Hemoglobin 5.1 (*) 13.0 - 17.0 g/dL   HCT 18.2 (*) 39.0  - 52.0 %   MCV 66.4 (*) 78.0 - 100.0 fL   MCH 18.6 (*) 26.0 - 34.0 pg   MCHC 28.0 (*) 30.0 - 36.0 g/dL   RDW 18.4 (*) 11.5 - 15.5 %   Platelets 322  150 - 400 K/uL  BASIC METABOLIC PANEL     Status: Abnormal   Collection Time    04/16/14  6:52 PM      Result Value Ref Range   Sodium 137  137 - 147 mEq/L   Potassium 4.8  3.7 - 5.3 mEq/L   Chloride 100  96 - 112 mEq/L   CO2  23  19 - 32 mEq/L   Glucose, Bld 101 (*) 70 - 99 mg/dL   BUN 58 (*) 6 - 23 mg/dL   Creatinine, Ser 3.19 (*) 0.50 - 1.35 mg/dL   Calcium 9.3  8.4 - 10.5 mg/dL   GFR calc non Af Amer 19 (*) >90 mL/min   GFR calc Af Amer 22 (*) >90 mL/min   Anion gap 14  5 - 15  TROPONIN I     Status: None   Collection Time    04/16/14  6:52 PM      Result Value Ref Range   Troponin I <0.30  <0.30 ng/mL    Dg Chest 2 View  04/13/2014   CLINICAL DATA:  Pre cardiac catheterization, former smoking history, chest pain  EXAM: CHEST  2 VIEW  COMPARISON:  None.  FINDINGS: No active infiltrate or effusion is seen. Mediastinal and hilar contours are unremarkable. The heart is mildly enlarged. A moderate size hiatal hernia is noted. There are degenerative changes in the lower thoracic spine.  IMPRESSION: Mild cardiomegaly. No active lung disease. Moderate size hiatal hernia.   Electronically Signed   By: Ivar Drape M.D.   On: 04/13/2014 16:09   Dg Chest Port 1 View  04/16/2014   CLINICAL DATA:  Chest pain. History of myocardial infarction. Mild chronic renal insufficiency.  EXAM: PORTABLE CHEST - 1 VIEW  COMPARISON:  04/13/2014  FINDINGS: Borderline cardiomegaly. No edema. Moderate-sized hiatal hernia. No pleural effusion.  The lungs appear clear.  IMPRESSION: 1. Moderate-sized hiatal hernia. 2. Borderline cardiomegaly.   Electronically Signed   By: Sherryl Barters M.D.   On: 04/16/2014 18:45     Waldemar Dickens, MD Family Medicine 04/16/2014, 8:23 PM [place]

## 2014-04-16 NOTE — ED Notes (Addendum)
Patient being admitted and transferred to 3A. Report given to [place] RN

## 2014-04-17 DIAGNOSIS — D649 Anemia, unspecified: Secondary | ICD-10-CM

## 2014-04-17 LAB — TROPONIN I
Troponin I: <0.3 ng/mL (ref <0.30)
Troponin I: <0.3 ng/mL (ref <0.30)

## 2014-04-17 LAB — FOLATE: Folate: >20 ng/mL

## 2014-04-17 LAB — RETICULOCYTES
RBC.: 3.7 MIL/uL — ABNORMAL LOW (ref 4.22–5.81)
Retic Count, Absolute: 48.1 10*3/uL (ref 19.0–186.0)
Retic Ct Pct: 1.3 % (ref 0.4–3.1)

## 2014-04-17 LAB — HEMOGLOBIN A1C
Hgb A1c MFr Bld: 5.5 % (ref <5.7)
Mean Plasma Glucose: 111 mg/dL (ref <117)

## 2014-04-17 LAB — FERRITIN: Ferritin: 2 ng/mL — ABNORMAL LOW (ref 22–322)

## 2014-04-17 LAB — BASIC METABOLIC PANEL
Anion gap: 12 (ref 5–15)
BUN: 48 mg/dL — ABNORMAL HIGH (ref 6–23)
CO2: 24 mEq/L (ref 19–32)
Calcium: 9.1 mg/dL (ref 8.4–10.5)
Chloride: 102 mEq/L (ref 96–112)
Creatinine, Ser: 2.61 mg/dL — ABNORMAL HIGH (ref 0.50–1.35)
GFR calc Af Amer: 29 mL/min — ABNORMAL LOW (ref >90)
GFR calc non Af Amer: 25 mL/min — ABNORMAL LOW (ref >90)
Glucose, Bld: 104 mg/dL — ABNORMAL HIGH (ref 70–99)
Potassium: 4.1 mEq/L (ref 3.7–5.3)
Sodium: 138 mEq/L (ref 137–147)

## 2014-04-17 LAB — IRON AND TIBC
Iron: 535 ug/dL — ABNORMAL HIGH (ref 42–135)
Saturation Ratios: 58 % — ABNORMAL HIGH (ref 20–55)
TIBC: 921 ug/dL — ABNORMAL HIGH (ref 215–435)
UIBC: 386 ug/dL (ref 125–400)

## 2014-04-17 LAB — VITAMIN B12: Vitamin B-12: 662 pg/mL (ref 211–911)

## 2014-04-17 MED ORDER — METOPROLOL SUCCINATE ER 50 MG PO TB24
100.0000 mg | ORAL_TABLET | Freq: Every day | ORAL | Status: DC
  Administered 2014-04-17 – 2014-04-20 (×4): 100 mg via ORAL
  Filled 2014-04-17 (×4): qty 2

## 2014-04-17 NOTE — Progress Notes (Signed)
On call coverage over weekend contacted about patient by GI. Patient regular of Dr Bronson Ing seen recently by NP Purcell Nails for ongoing chest pain and DOE. He has been scheduled for cath later this upcoming week for evaluation. Admitted over the weekend with chest pain, found to have severe anemia Hgb of 5. Plan for EGD and colonoscopy Monday. Per report symptoms of DOE have improved with transfusion. EGD and colonoscopy are considered low risk procedures, he has no evidence of acute ischemia by EKG or enzymes. Recommend proceeding with procedures as planned. The [facility] consult service will see on Monday for full consult. Will need to be decided if to delay or even cancel cath if symptoms could all be explained by GI etiology for pain and anemia as his causes of DOE.    Zandra Abts MD

## 2014-04-17 NOTE — Progress Notes (Signed)
Subjective: The patient is comfortable at the present time. He has completed 2 units of packed RBCs. He was admitted to the ED after having presented there with chest pain was found to have extremely low hemoglobin 5.1 and heme positive stool. He currently is relatively asymptomatic. He is scheduled for heart catheterization next week.  Objective: Vital signs in last 24 hours: Temp:  [98 F (36.7 C)-99 F (37.2 C)] 98.1 F (36.7 C) (07/18 0400) Pulse Rate:  [71-103] 76 (07/18 0400) Resp:  [15-18] 16 (07/18 0400) BP: (101-123)/(56-70) 108/61 mmHg (07/18 0400) SpO2:  [99 %-100 %] 99 % (07/18 0400) Weight:  [94.802 kg (209 lb)-97.977 kg (216 lb)] 97.977 kg (216 lb) (07/17 2135) Weight change:  Last BM Date: 04/16/14  Intake/Output from previous day: 07/17 0701 - 07/18 0700 In: 645 [Blood:645] Out: 825 [Urine:825] Intake/Output this shift:    Physical Exam: General appearance the patient is alert and oriented  HEENT negative  Neck supple no JVD or thyroid abnormalities  Heart regular rhythm no murmurs  Lungs clear to P&A  Abdomen no tenderness no palpable organs or masses  Skin warm and dry mild pallor  Extremities free of edema   Recent Labs  04/16/14 1852 04/16/14 2055  WBC 6.1 6.2  HGB 5.1* 5.2*  HCT 18.2* 18.3*  PLT 322 334   BMET  Recent Labs  04/16/14 1852 04/17/14 0631  NA 137 138  K 4.8 4.1  CL 100 102  CO2 23 24  GLUCOSE 101* 104*  BUN 58* 48*  CREATININE 3.19* 2.61*  CALCIUM 9.3 9.1    Studies/Results: Dg Chest Port 1 View  04/16/2014   CLINICAL DATA:  Chest pain. History of myocardial infarction. Mild chronic renal insufficiency.  EXAM: PORTABLE CHEST - 1 VIEW  COMPARISON:  04/13/2014  FINDINGS: Borderline cardiomegaly. No edema. Moderate-sized hiatal hernia. No pleural effusion.  The lungs appear clear.  IMPRESSION: 1. Moderate-sized hiatal hernia. 2. Borderline cardiomegaly.   Electronically Signed   By: Sherryl Barters M.D.   On:  04/16/2014 18:45    Medications:  . sodium chloride   Intravenous STAT  . aspirin  81 mg Oral Pre-Cath  . aspirin EC  81 mg Oral Daily  . heparin  5,000 Units Subcutaneous 3 times per day  . simvastatin  40 mg Oral q1800  . sodium chloride  3 mL Intravenous Q12H    . sodium chloride 100 mL/hr at 04/17/14 0443  . sodium chloride       Assessment/Plan: 1. Profound anemia secondary to gastrointestinal bleeding plan to complete transfusions obtained GI consult with possible endoscopy  2 history of hypertension but hypotensive on admission plan continue IV fluids continue to monitor  3. Chronic kidney disease stage II  4. Hiatal hernia  5. Coronary artery disease which is stable at present but will require further evaluation when more stable   LOS: 1 day   Zurii Hewes G 04/17/2014, 9:36 AM

## 2014-04-17 NOTE — Consult Note (Signed)
Referring Provider: No ref. provider found Primary Care Physician:  Lanette Hampshire, MD Primary Gastroenterologist:  Dr. Laural Golden  Reason for Consultation:  Profound microcytic anemia/Hemoccult positive stool  HPI:  63 year old gentleman admitted to the hospital yesterday with insidiously progressive chest pain and dyspnea on exertion. He was found to have a hemoglobin in the 5 range with markedly microcytic indices with an MCV in the 60s. He also was found to be occult blood positive on digital rectal exam. Patient reports progressive dyspnea and chest pain over the past several weeks. Has not had any melena, hematochezia or hematemesis. He has a history of coronary artery disease. He was seen by Dr. Dwana Curd recently. who scheduled a cardiac catheterization the latter part of next week. 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 Troponins negative thus far. After receiving 2 units of packed blood cells, hemoglobin is up in the 8 range; patient tells me he feels much better - in particular,  dyspnea and chest pain resolved.  Patient tells me he's never had a colonoscopy. He has frequent GERD symptoms and intermittent esophageal dysphagia to solid food. He describes having an EGD by Dr. Laural Golden many years ago. Positive tobacco in the past. No alcohol. No family history of GI malignancy.    Past Medical History  Diagnosis Date  . Hypertension   . Hyperlipidemia   . Chest pain, exertional   . Chronic kidney disease     kidney stones    Past Surgical History  Procedure Laterality Date  . Hernia repair      X2    Prior to Admission medications   Medication Sig Start Date End Date Taking? Authorizing Provider  amLODipine (NORVASC) 10 MG tablet Take 5 mg by mouth daily.   Yes Historical Provider, MD  Ascorbic Acid (VITAMIN C) 1000 MG tablet Take 1,000 mg by mouth daily.   Yes Historical Provider, MD  aspirin  EC 325 MG tablet Take 325 mg by mouth once as needed (for chest pain).   Yes Historical Provider, MD  aspirin EC 81 MG tablet Take 81 mg by mouth daily.   Yes Historical Provider, MD  Coenzyme Q10 (CO Q 10) 100 MG CAPS Take 100 mg by mouth daily.   Yes Historical Provider, MD  Flaxseed, Linseed, (FLAX SEED OIL) 1000 MG CAPS Take 1,000 mg by mouth daily.   Yes Historical Provider, MD  lisinopril-hydrochlorothiazide (PRINZIDE,ZESTORETIC) 20-12.5 MG per tablet TAKE 2 TABLETS BY MOUTH ONCE DAILY. 07/31/13  Yes Herminio Commons, MD  lisinopril-hydrochlorothiazide (PRINZIDE,ZESTORETIC) 20-12.5 MG per tablet Take 2 tablets by mouth daily.   Yes Historical Provider, MD  Lycopene 10 MG CAPS Take 10 mg by mouth daily.   Yes Historical Provider, MD  metoprolol succinate (TOPROL-XL) 100 MG 24 hr tablet Take 1 tablet (100 mg total) by mouth daily. Take with or immediately following a meal. 07/31/13  Yes Herminio Commons, MD  Multiple Vitamin (MULTIVITAMIN) tablet Take 1 tablet by mouth daily.   Yes Historical Provider, MD  Omega-3 Fatty Acids (FISH OIL) 1200 MG CAPS Take 1,200 mg by mouth daily.   Yes Historical Provider, MD  pravastatin (PRAVACHOL) 80 MG tablet Take 1 tablet (80 mg total) by mouth every evening. 07/31/13  Yes Herminio Commons, MD  saw palmetto 160 MG capsule Take 160 mg by mouth daily.   Yes Historical Provider, MD  spironolactone (ALDACTONE) 25 MG tablet Take 1  tablet (25 mg total) by mouth daily. 07/31/13  Yes Herminio Commons, MD    Current Facility-Administered Medications  Medication Dose Route Frequency Provider Last Rate Last Dose  . 0.45 % sodium chloride infusion   Intravenous Continuous Waldemar Dickens, MD 100 mL/hr at 04/17/14 0443    . 0.9 %  sodium chloride infusion   Intravenous STAT Fredia Sorrow, MD      . 0.9 %  sodium chloride infusion  250 mL Intravenous PRN Lendon Colonel, NP      . 0.9 %  sodium chloride infusion   Intravenous Continuous Lendon Colonel, NP      . acetaminophen (TYLENOL) tablet 650 mg  650 mg Oral Q4H PRN Waldemar Dickens, MD      . aspirin chewable tablet 81 mg  81 mg Oral Pre-Cath Lendon Colonel, NP      . aspirin EC tablet 81 mg  81 mg Oral Daily Waldemar Dickens, MD      . gi cocktail (Maalox,Lidocaine,Donnatal)  30 mL Oral QID PRN Waldemar Dickens, MD      . heparin injection 5,000 Units  5,000 Units Subcutaneous 3 times per day Waldemar Dickens, MD   5,000 Units at 04/17/14 0534  . ondansetron (ZOFRAN) injection 4 mg  4 mg Intravenous Q6H PRN Waldemar Dickens, MD      . simvastatin (ZOCOR) tablet 40 mg  40 mg Oral q1800 Waldemar Dickens, MD   40 mg at 04/17/14 0101  . sodium chloride 0.9 % injection 3 mL  3 mL Intravenous Q12H Lendon Colonel, NP   3 mL at 04/17/14 1004  . sodium chloride 0.9 % injection 3 mL  3 mL Intravenous PRN Lendon Colonel, NP        Allergies as of 04/16/2014 - Review Complete 04/16/2014  Allergen Reaction Noted  . Codeine Nausea And Vomiting 06/25/2012    Family History  Problem Relation Age of Onset  . Heart attack Mother 2    Cause of death  . Heart attack Father 66    Cause of death  . Hypertension Sister     History   Social History  . Marital Status: Divorced    Spouse Name: N/A    Number of Children: N/A  . Years of Education: N/A   Occupational History  . Not on file.   Social History Main Topics  . Smoking status: Former Smoker -- 0.80 packs/day for 15 years    Types: Cigarettes    Quit date: 10/01/2013  . Smokeless tobacco: Not on file     Comment: Patient is trying electronic cigarette.  . Alcohol Use: Yes     Comment: Rare  . Drug Use: No  . Sexual Activity: Not on file   Other Topics Concern  . Not on file   Social History Narrative  . No narrative on file    Review of Systems: Gen: Denies any fever, chills, sweats, anorexia, weight loss,  CV: Denies chest pain, angina, palpitations, syncope, orthopnea, PND, peripheral edema, and  claudication. Resp: Denies  sputum, wheezing, coughing up blood, and pleurisy. GI: Denies vomiting blood, jaundice, and fecal incontinence.  Derm: Denies rash, itching, dry skin, hives, moles, warts, or unhealing ulcers.  Psych: Denies depression, anxiety, memory loss, suicidal ideation, hallucinations, paranoia, and confusion. Heme: Denies bruising, bleeding, and enlarged lymph nodes.   Physical Exam: Vital signs in last 24 hours: Temp:  [98 F (36.7 C)-99  F (37.2 C)] 98.1 F (36.7 C) (07/18 0400) Pulse Rate:  [71-103] 76 (07/18 0400) Resp:  [15-18] 16 (07/18 0400) BP: (101-123)/(56-70) 108/61 mmHg (07/18 0400) SpO2:  [99 %-100 %] 99 % (07/18 0400) Weight:  [209 lb (94.802 kg)-216 lb (97.977 kg)] 216 lb (97.977 kg) (07/17 2135) Last BM Date: 04/16/14 General:   Alert,   pleasant and cooperative in NAD Head:  Normocephalic and atraumatic. Eyes:  Sclera clear, no icterus.   Conjunctiva pink. Ears:  Normal auditory acuity. Nose:  No deformity, discharge,  or lesions. Mouth:  No deformity or lesions, dentition normal. Neck:  Supple; no masses or thyromegaly. Lungs:  Clear throughout to auscultation.   No wheezes, crackles, or rhonchi. No acute distress. Heart:  Regular rate and rhythm; no murmurs, clicks, rubs,  or gallops. Abdomen:  Nondistended. Positive bowel sounds. Soft and nontender without appreciable mass or organomegaly  Rectal:  Deferred until time of colonoscopy.    Intake/Output from previous day: 07/17 0701 - 07/18 0700 In: 645 [Blood:645] Out: 825 [Urine:825] Intake/Output this shift: Total I/O In: 540 [I.V.:540] Out: -   Lab Results:  Recent Labs  04/16/14 1852 04/16/14 2055  WBC 6.1 6.2  HGB 5.1* 5.2*  HCT 18.2* 18.3*  PLT 322 334   BMET  Recent Labs  04/16/14 1852 04/17/14 0631  NA 137 138  K 4.8 4.1  CL 100 102  CO2 23 24  GLUCOSE 101* 104*  BUN 58* 48*  CREATININE 3.19* 2.61*  CALCIUM 9.3 9.1   LFT  Recent Labs  04/16/14 2010    PROT 6.3  ALBUMIN 3.4*  AST 14  ALT 13  ALKPHOS 33  BILITOT 0.2*  BILIDIR <0.2  IBILI NOT CALCULATED   Impression:  Very pleasant 63 year old gentleman admitted to the hospital with progressive chest pain and dyspnea in the setting of a profound microcytic anemia and Hemoccult-positive stool.  His symptoms are essentially resolved after getting 2 units of packed red blood cells. He's never had a colonoscopy. He has frequent symptoms of untreated GERD and esophageal dysphagia to solids. Patient needs both a diagnostic colonoscopy and an EGD with esophageal dilation as appropriate.  I have spoken to Dr. Carlyle Dolly, the on call cardiologist.  He feels it is safe to proceed with endoscopic evaluation the first of the week prior to cardiac catheterization. In fact, he may well not need a cardiac catheterization. I have also spoken to Dr. Leighton Roach.  Recommendations: EGD with esophageal dilation and diagnostic colonoscopy June 20th.  The risks, benefits, limitations, imponderables and alternatives regarding both EGD and colonoscopy have been reviewed with the patient. Questions have been answered. All parties agreeable.   Notice:  This dictation was prepared with Dragon dictation along with smaller phrase technology. Any transcriptional errors that result from this process are unintentional and may not be corrected upon review.

## 2014-04-17 NOTE — Progress Notes (Signed)
Notified Dr. [name] that pts home medications had not been resumed. He states that if PCP wants to resume then it will not interfere with EGD that is scheduled for tomorrow. Dr Legrand Rams is on call for Dr. Everette Rank, Dr. Legrand Rams was notified and order to resume metoprolol from home meds, states that pt can wait on vitamins, and have Dr. Everette Rank evaluate in the am. Marry Guan

## 2014-04-18 LAB — CBC
HCT: 26.2 % — ABNORMAL LOW (ref 39.0–52.0)
Hemoglobin: 7.8 g/dL — ABNORMAL LOW (ref 13.0–17.0)
MCH: 21.4 pg — ABNORMAL LOW (ref 26.0–34.0)
MCHC: 29.8 g/dL — ABNORMAL LOW (ref 30.0–36.0)
MCV: 72 fL — ABNORMAL LOW (ref 78.0–100.0)
Platelets: 287 10*3/uL (ref 150–400)
RBC: 3.64 MIL/uL — ABNORMAL LOW (ref 4.22–5.81)
RDW: 21.7 % — ABNORMAL HIGH (ref 11.5–15.5)
WBC: 4.6 10*3/uL (ref 4.0–10.5)

## 2014-04-18 LAB — PREPARE RBC (CROSSMATCH)

## 2014-04-18 MED ORDER — ALPRAZOLAM 0.5 MG PO TABS
0.5000 mg | ORAL_TABLET | Freq: Once | ORAL | Status: DC
  Filled 2014-04-18: qty 1

## 2014-04-18 MED ORDER — PEG 3350-KCL-NA BICARB-NACL 420 G PO SOLR
4000.0000 mL | Freq: Once | ORAL | Status: AC
  Administered 2014-04-18: 4000 mL via ORAL
  Filled 2014-04-18: qty 4000

## 2014-04-18 NOTE — Progress Notes (Signed)
Subjective: The patient is fairly comfortable his had no further chest pain and is not noted any gastrointestinal bleeding. He had 2 units of packed RBCs yesterday. Last recorded hemoglobin was 5.2 patient scheduled for CBC today. Patient has been seen by gastroenterology service. Upper endoscopy and colonoscopy are scheduled for tomorrow.  Objective: Vital signs in last 24 hours: Temp:  [97.9 F (36.6 C)-98.2 F (36.8 C)] 97.9 F (36.6 C) (07/19 0502) Pulse Rate:  [74-88] 74 (07/19 0502) Resp:  [18-20] 20 (07/19 0502) BP: (117-137)/(54-82) 119/54 mmHg (07/19 0502) SpO2:  [96 %-100 %] 96 % (07/19 0502) Weight change:  Last BM Date: 04/16/14  Intake/Output from previous day: 07/18 0701 - 07/19 0700 In: 1020 [P.O.:480; I.V.:540] Out: 400 [Urine:400] Intake/Output this shift:    Physical Exam: General appearance patient is alert and oriented  HEENT negative  Neck supple no JVD or thyroid abnormalities  Lungs clear to P&A  Heart regular rhythm no murmurs  Abdomen no tenderness no palpable organs or masses  Skin warm and dry  Extremities free of edema   Recent Labs  04/16/14 1852 04/16/14 2055  WBC 6.1 6.2  HGB 5.1* 5.2*  HCT 18.2* 18.3*  PLT 322 334   BMET  Recent Labs  04/16/14 1852 04/17/14 0631  NA 137 138  K 4.8 4.1  CL 100 102  CO2 23 24  GLUCOSE 101* 104*  BUN 58* 48*  CREATININE 3.19* 2.61*  CALCIUM 9.3 9.1    Studies/Results: Dg Chest Port 1 View  04/16/2014   CLINICAL DATA:  Chest pain. History of myocardial infarction. Mild chronic renal insufficiency.  EXAM: PORTABLE CHEST - 1 VIEW  COMPARISON:  04/13/2014  FINDINGS: Borderline cardiomegaly. No edema. Moderate-sized hiatal hernia. No pleural effusion.  The lungs appear clear.  IMPRESSION: 1. Moderate-sized hiatal hernia. 2. Borderline cardiomegaly.   Electronically Signed   By: Sherryl Barters M.D.   On: 04/16/2014 18:45    Medications:  . ALPRAZolam  0.5 mg Oral Once  . aspirin EC  81  mg Oral Daily  . metoprolol succinate  100 mg Oral Daily  . simvastatin  40 mg Oral q1800  . sodium chloride  3 mL Intravenous Q12H    . sodium chloride 100 mL/hr at 04/18/14 0314  . sodium chloride       Assessment/Plan: 1. Gastrointestinal bleeding undetermined source, anemia secondary to bleeding plan to recheck CBC this morning further transfusion if needed. Patient is scheduled for upper and lower endoscopy for tomorrow by gastroenterology service.  2 hypotensive on admission but normotensive now plan continue to monitor blood pressures  3 coronary artery disease which is stable. Cardiologist felt it was safe to proceed with upper and lower endoscopy. Patient's troponin is normal he's had no further chest pain  4 chronic kidney disease stage 3-continue IV fluids continue monitor chemistries   LOS: 2 days   Sherlie Boyum G 04/18/2014, 7:02 AM

## 2014-04-18 NOTE — Progress Notes (Signed)
Patient without complaints. No obvious GI bleeding. On a clear liquid diet. Hemoglobin 7.8 this morning; 2 more units have been ordered.   Plan:   EGD with esophageal dilation as appropriate and diagnostic colonoscopy tomorrow. We'll give him a split dose colon prep. Hemoglobin up to 7.8-I believe he would do fine with only one more unit of packed red blood cells; I am going to change the order to 1 unit of packed red blood cells.. Check CBC tomorrow morning. Discussed with nursing staff.

## 2014-04-19 ENCOUNTER — Encounter (HOSPITAL_COMMUNITY): Payer: Self-pay

## 2014-04-19 ENCOUNTER — Encounter (HOSPITAL_COMMUNITY)
Admission: EM | Disposition: A | Discharge status: Home / Self Care | Payer: Self-pay | Source: Home / Self Care | Attending: Family Medicine

## 2014-04-19 DIAGNOSIS — D508 Other iron deficiency anemias: Secondary | ICD-10-CM

## 2014-04-19 DIAGNOSIS — R079 Chest pain, unspecified: Secondary | ICD-10-CM

## 2014-04-19 HISTORY — PX: COLONOSCOPY: SHX5424

## 2014-04-19 HISTORY — PX: ESOPHAGOGASTRODUODENOSCOPY: SHX5428

## 2014-04-19 LAB — CBC
HCT: 31.5 % — ABNORMAL LOW (ref 39.0–52.0)
Hemoglobin: 9.4 g/dL — ABNORMAL LOW (ref 13.0–17.0)
MCH: 22.3 pg — ABNORMAL LOW (ref 26.0–34.0)
MCHC: 29.8 g/dL — ABNORMAL LOW (ref 30.0–36.0)
MCV: 74.8 fL — ABNORMAL LOW (ref 78.0–100.0)
Platelets: 318 10*3/uL (ref 150–400)
RBC: 4.21 MIL/uL — ABNORMAL LOW (ref 4.22–5.81)
RDW: 22.7 % — ABNORMAL HIGH (ref 11.5–15.5)
WBC: 5.4 10*3/uL (ref 4.0–10.5)

## 2014-04-19 LAB — BASIC METABOLIC PANEL
Anion gap: 11 (ref 5–15)
BUN: 22 mg/dL (ref 6–23)
CO2: 25 mEq/L (ref 19–32)
Calcium: 8.5 mg/dL (ref 8.4–10.5)
Chloride: 106 mEq/L (ref 96–112)
Creatinine, Ser: 1.85 mg/dL — ABNORMAL HIGH (ref 0.50–1.35)
GFR calc Af Amer: 43 mL/min — ABNORMAL LOW (ref >90)
GFR calc non Af Amer: 37 mL/min — ABNORMAL LOW (ref >90)
Glucose, Bld: 96 mg/dL (ref 70–99)
Potassium: 4.1 mEq/L (ref 3.7–5.3)
Sodium: 142 mEq/L (ref 137–147)

## 2014-04-19 SURGERY — EGD (ESOPHAGOGASTRODUODENOSCOPY)
Anesthesia: Moderate Sedation

## 2014-04-19 MED ORDER — MIDAZOLAM HCL 5 MG/5ML IJ SOLN
INTRAMUSCULAR | Status: DC | PRN
  Administered 2014-04-19: 1 mg via INTRAVENOUS
  Administered 2014-04-19: 2 mg via INTRAVENOUS
  Administered 2014-04-19 (×3): 1 mg via INTRAVENOUS
  Administered 2014-04-19: 2 mg via INTRAVENOUS

## 2014-04-19 MED ORDER — LIDOCAINE VISCOUS 2 % MT SOLN
OROMUCOSAL | Status: DC | PRN
  Administered 2014-04-19: 4 mL via OROMUCOSAL

## 2014-04-19 MED ORDER — MIDAZOLAM HCL 5 MG/5ML IJ SOLN
INTRAMUSCULAR | Status: AC
Start: 2014-04-19 — End: 2014-04-20
  Filled 2014-04-19: qty 10

## 2014-04-19 MED ORDER — SODIUM CHLORIDE 0.9 % IV SOLN: INTRAVENOUS | Status: DC

## 2014-04-19 MED ORDER — MEPERIDINE HCL 100 MG/ML IJ SOLN
INTRAMUSCULAR | Status: DC | PRN
  Administered 2014-04-19: 25 mg via INTRAVENOUS
  Administered 2014-04-19: 50 mg via INTRAVENOUS

## 2014-04-19 MED ORDER — MEPERIDINE HCL 100 MG/ML IJ SOLN
INTRAMUSCULAR | Status: AC
  Filled 2014-04-19: qty 2

## 2014-04-19 MED ORDER — STERILE WATER FOR IRRIGATION IR SOLN
Status: DC | PRN
  Administered 2014-04-19: 16:00:00

## 2014-04-19 MED ORDER — PANTOPRAZOLE SODIUM 40 MG PO TBEC
40.0000 mg | DELAYED_RELEASE_TABLET | Freq: Two times a day (BID) | ORAL | Status: DC
  Administered 2014-04-19 – 2014-04-20 (×2): 40 mg via ORAL
  Filled 2014-04-19 (×2): qty 1

## 2014-04-19 MED ORDER — ONDANSETRON HCL 4 MG/2ML IJ SOLN
INTRAMUSCULAR | Status: AC
Start: 2014-04-19 — End: 2014-04-20
  Filled 2014-04-19: qty 2

## 2014-04-19 MED ORDER — ONDANSETRON HCL 4 MG/2ML IJ SOLN
INTRAMUSCULAR | Status: DC | PRN
Start: 2014-04-19 — End: 2014-04-19
  Administered 2014-04-19: 4 mg via INTRAVENOUS

## 2014-04-19 MED ORDER — LIDOCAINE VISCOUS 2 % MT SOLN
OROMUCOSAL | Status: AC
Start: 2014-04-19 — End: 2014-04-20
  Filled 2014-04-19: qty 15

## 2014-04-19 NOTE — Op Note (Signed)
[facility] [address] [place], [address]   COLONOSCOPY PROCEDURE REPORT  PATIENT: Jeremiah Calhoun, Jeremiah Calhoun  MR#:         [id number] BIRTHDATE: 26-Sep-1951 , 32  yrs. old GENDER: Male ENDOSCOPIST: R.  Garfield Cornea, MD FACP FACG REFERRED BY:  Marjean Donna, M.D. PROCEDURE DATE:  04/19/2014 PROCEDURE:     Ileocolonoscopy with snare polypectomy  INDICATIONS: Profound iron deficiency anemia; Hemoccult positive stool; no prior colonoscopy  INFORMED CONSENT:  The risks, benefits, alternatives and imponderables including but not limited to bleeding, perforation as well as the possibility of a missed lesion have been reviewed.  The potential for biopsy, lesion removal, etc. have also been discussed.  Questions have been answered.  All parties agreeable. Please see the history and physical in the medical record for more information.  MEDICATIONS: Versed 8 mg IV and Demerol 75 mg IV in divided doses. Zofran 4 mg IV.  DESCRIPTION OF PROCEDURE:  After a digital rectal exam was performed, the EG-[id number] ([id number]) and EC-3890Li ([id number]) colonoscope was advanced from the anus through the rectum and colon to the area of the cecum, ileocecal valve and appendiceal orifice. The cecum was deeply intubated.  These structures were well-seen and photographed for the record.  From the level of the cecum and ileocecal valve, the scope was slowly and cautiously withdrawn. The mucosal surfaces were carefully surveyed utilizing scope tip deflection to facilitate fold flattening as needed.  The scope was pulled down into the rectum where a thorough examination including retroflexion was performed.    FINDINGS:  Adequate preparation. Internal hemorrhoids; otherwise, normal rectum. Somewhat of a redundant colon. Scattered left-sided diverticula; (1) 6 mm flat polyp in the mid ascending segment; otherwise, the remainder of the colonic mucosa appeared normal. The distal 10 cm of terminal ileal  mucosa also appeared normal.  THERAPEUTIC / DIAGNOSTIC MANEUVERS PERFORMED:  The above-mentioned polyp was hot snare removed. Recovery was still in process at time of this dictation.  COMPLICATIONS: none  CECAL WITHDRAWAL TIME:  13 minute  IMPRESSION:  Internal hemorrhoids. Colonic diverticulosis. Single colonic polyp removed as described above.  RECOMMENDATIONS: Advance to  a heart healthy diet. Followup on pathology. See EGD report.   _______________________________ eSigned:  R. Garfield Cornea, MD FACP [facility] 04/19/2014 4:54 PM   CC:    PATIENT NAME:  [name], [name] MR#: [id number]

## 2014-04-19 NOTE — Op Note (Signed)
[facility] [address] [place], [address]   ENDOSCOPY PROCEDURE REPORT  PATIENT: Jeremiah Calhoun, Jeremiah Calhoun  MR#: [id number] BIRTHDATE: 04/28/1951 , 21  yrs. old GENDER: Male ENDOSCOPIST: R.  Garfield Cornea, MD FACP FACG REFERRED BY:  Marjean Donna, M.D. PROCEDURE DATE:  04/19/2014 PROCEDURE:     EGD with Venia Minks dilation followed by gastric biopsy  INDICATIONS:     Long-standing GERD; esophageal dysphagia; iron deficiency anemia; Hemoccult positive stool  INFORMED CONSENT:   The risks, benefits, limitations, alternatives and imponderables have been discussed.  The potential for biopsy, esophogeal dilation, etc. have also been reviewed.  Questions have been answered.  All parties agreeable.  Please see the history and physical in the medical record for more information.  MEDICATIONS:      Versed 6 mg IV and Demerol 75 mg IV in divided doses. Zofran 4 mg IV. Xylocaine gel orally  DESCRIPTION OF PROCEDURE:   The OF-[id number] ([id number])  endoscope was introduced through the mouth and advanced to the second portion of the duodenum without difficulty or limitations.  The mucosal surfaces were surveyed very carefully during advancement of the scope and upon withdrawal.  Retroflexion view of the proximal stomach and esophagogastric junction was performed.      FINDINGS: Short distal peptic appearing esophageal stricture with circumferential distal esophageal erosions within 1 cm of the distal esophagus. No tumor. No obvious Barrett's esophagus. Scope easily traversed the GE junction.Stomach empty. 5 cm hiatal hernia with erosions straddling the GE junction. Also, multiple antral erosions. No ulcer or infiltrating process.  Patent pylorus. Normal first and second portion of the duodenum.  THERAPEUTIC / DIAGNOSTIC MANEUVERS PERFORMED:  A 54 French Maloney dilator was passed to full insertion easily. A look back revealed the stricture had been dilated without apparent  complication. Subsequently, the abnormal gastric mucosa was biopsied for histologic study.   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 5 cm hiatal hernia. Gastric erosions.    Status post Maloney dilation and gastric biopsy  RECOMMENDATIONS:  Continue PPI therapy daily. Followup on pathology. See colonoscopy report.    _______________________________ R. Garfield Cornea, MD FACP [facility] eSigned:  R. Garfield Cornea, MD FACP [facility] 04/19/2014 4:21 PM     CC:  PATIENT NAME:  Jeremiah Calhoun MR#: [id number]

## 2014-04-19 NOTE — Consult Note (Signed)
Primary Physician: Primary Cardiologist:  Bronson Ing   HPI:  Patient is a 63 yo who was admitted on Fri for CP and SOB The patinet has a history of presumed CAD (never had cath  Myoview in 2013 that showed possible mild scarring in inferior base with mildly depressed LV function.  Followed with stable symptoms Pt says over the past month he has noticed increased fatigue, more SOB with activity.  He also started having chest pressure/shoulder pressure with activity. Symptoms worsened.  Seen by Arnold Long.  Concern for unstable angina.  Set up for cath this week  The patinet was driving Fri.  Stopped at store.  When he got back into truck started developing chest tightness.  Severe   Positive SOB  Symptoms severe.  Grad eased with rest  He called 911 and symtpoms ere near gone when arrived.  The patinet was admitted to [facility]  Hgb was found to be severely depressed at 5.1  He has received 3 u PRBC Patient says with each unit he has felt better.   Now denies CP  No SOB   To undergo endoscopy today      Past Medical History  Diagnosis Date  . Hypertension   . Hyperlipidemia   . Chest pain, exertional   . Chronic kidney disease     kidney stones    Medications Prior to Admission  Medication Sig Dispense Refill  . amLODipine (NORVASC) 10 MG tablet Take 5 mg by mouth daily.      . Ascorbic Acid (VITAMIN C) 1000 MG tablet Take 1,000 mg by mouth daily.      Marland Kitchen aspirin EC 325 MG tablet Take 325 mg by mouth once as needed (for chest pain).      Marland Kitchen aspirin EC 81 MG tablet Take 81 mg by mouth daily.      . Coenzyme Q10 (CO Q 10) 100 MG CAPS Take 100 mg by mouth daily.      . Flaxseed, Linseed, (FLAX SEED OIL) 1000 MG CAPS Take 1,000 mg by mouth daily.      Marland Kitchen lisinopril-hydrochlorothiazide (PRINZIDE,ZESTORETIC) 20-12.5 MG per tablet TAKE 2 TABLETS BY MOUTH ONCE DAILY.  60 tablet  11  . lisinopril-hydrochlorothiazide (PRINZIDE,ZESTORETIC) 20-12.5 MG per tablet Take 2 tablets by mouth daily.       . Lycopene 10 MG CAPS Take 10 mg by mouth daily.      . metoprolol succinate (TOPROL-XL) 100 MG 24 hr tablet Take 1 tablet (100 mg total) by mouth daily. Take with or immediately following a meal.  90 tablet  3  . Multiple Vitamin (MULTIVITAMIN) tablet Take 1 tablet by mouth daily.      . Omega-3 Fatty Acids (FISH OIL) 1200 MG CAPS Take 1,200 mg by mouth daily.      . pravastatin (PRAVACHOL) 80 MG tablet Take 1 tablet (80 mg total) by mouth every evening.  30 tablet  11  . saw palmetto 160 MG capsule Take 160 mg by mouth daily.      Marland Kitchen spironolactone (ALDACTONE) 25 MG tablet Take 1 tablet (25 mg total) by mouth daily.  90 tablet  3     . ALPRAZolam  0.5 mg Oral Once  . aspirin EC  81 mg Oral Daily  . metoprolol succinate  100 mg Oral Daily  . simvastatin  40 mg Oral q1800  . sodium chloride  3 mL Intravenous Q12H    Infusions: . sodium chloride 100 mL/hr at 04/18/14 1647  .  sodium chloride      Allergies  Allergen Reactions  . Codeine Nausea And Vomiting    History   Social History  . Marital Status: Divorced    Spouse Name: N/A    Number of Children: N/A  . Years of Education: N/A   Occupational History  . Not on file.   Social History Main Topics  . Smoking status: Former Smoker -- 0.80 packs/day for 15 years    Types: Cigarettes    Quit date: 10/01/2013  . Smokeless tobacco: Not on file     Comment: Patient is trying electronic cigarette.  . Alcohol Use: Yes     Comment: Rare  . Drug Use: No  . Sexual Activity: Not on file   Other Topics Concern  . Not on file   Social History Narrative  . No narrative on file    Family History  Problem Relation Age of Onset  . Heart attack Mother 81    Cause of death  . Heart attack Father 61    Cause of death  . Hypertension Sister     REVIEW OF SYSTEMS:  All systems reviewed  Negative to the above problem except as noted above.    PHYSICAL EXAM: Filed Vitals:   04/19/14 0622  BP: 120/77  Pulse: 71    Temp: 98.3 F (36.8 C)  Resp: 18     Intake/Output Summary (Last 24 hours) at 04/19/14 0913 Last data filed at 04/19/14 0600  Gross per 24 hour  Intake 3559.17 ml  Output      0 ml  Net 3559.17 ml    General:  Well appearing. No respiratory difficulty HEENT: normal Neck: supple. no JVD. Carotids 2+ bilat; no bruits. No lymphadenopathy or thryomegaly appreciated. Cor: PMI nondisplaced. Regular rate & rhythm. No rubs, gallops or murmurs. Lungs: clear Abdomen: soft, nontender, nondistended. No hepatosplenomegaly. No bruits or masses. Good bowel sounds. Extremities: no cyanosis, clubbing, rash, edema Neuro: alert & oriented x 3, cranial nerves grossly intact. moves all 4 extremities w/o difficulty. Affect pleasant.  ECG:  SR 74  First degee AV block  Results for orders placed during the hospital encounter of 04/16/14 (from the past 24 hour(s))  PREPARE RBC (CROSSMATCH)     Status: None   Collection Time    04/18/14  9:50 AM      Result Value Ref Range   Order Confirmation ORDER PROCESSED BY BLOOD BANK    BASIC METABOLIC PANEL     Status: Abnormal   Collection Time    04/19/14  6:36 AM      Result Value Ref Range   Sodium 142  137 - 147 mEq/L   Potassium 4.1  3.7 - 5.3 mEq/L   Chloride 106  96 - 112 mEq/L   CO2 25  19 - 32 mEq/L   Glucose, Bld 96  70 - 99 mg/dL   BUN 22  6 - 23 mg/dL   Creatinine, Ser 1.85 (*) 0.50 - 1.35 mg/dL   Calcium 8.5  8.4 - 10.5 mg/dL   GFR calc non Af Amer 37 (*) >90 mL/min   GFR calc Af Amer 43 (*) >90 mL/min   Anion gap 11  5 - 15  CBC     Status: Abnormal   Collection Time    04/19/14  6:36 AM      Result Value Ref Range   WBC 5.4  4.0 - 10.5 K/uL   RBC 4.21 (*) 4.22 - 5.81 MIL/uL  Hemoglobin 9.4 (*) 13.0 - 17.0 g/dL   HCT 31.5 (*) 39.0 - 52.0 %   MCV 74.8 (*) 78.0 - 100.0 fL   MCH 22.3 (*) 26.0 - 34.0 pg   MCHC 29.8 (*) 30.0 - 36.0 g/dL   RDW 22.7 (*) 11.5 - 15.5 %   Platelets 318  150 - 400 K/uL   No results  found.   ASSESSMENT: Patient is a 63 yo with no documented CAD  Presented on Fr with severe CP and SOB  R/O for MI  Hgb noted to be 5.1 His symptoms resolved with transfusion  I think his symptoms were exacerbated by low oxygen delivery to cells.  Despite this he had no signs of myocardial injury which goes against signif CAD  I agree with plans for GI evaluation. I would cancel plans for cardiac eval  I will make sure that patient has f/u in clinic to confimr that he continues to do well.  Will be available as needed.

## 2014-04-19 NOTE — Progress Notes (Signed)
Subjective: The patient is fairly comfortable at the present time. He had another unit of packed RBCs yesterday his hemoglobin was 7.8 his had no active bleeding but had heme positive stool. His been seen by gastroenterology service and EGD and colonoscopy has been scheduled for today his had no further chest pain  Objective: Vital signs in last 24 hours: Temp:  [97.9 F (36.6 C)-98.4 F (36.9 C)] 98.3 F (36.8 C) (07/20 0622) Pulse Rate:  [64-71] 71 (07/20 0622) Resp:  [16-24] 18 (07/20 0622) BP: (120-138)/(69-83) 120/77 mmHg (07/20 0622) SpO2:  [99 %-100 %] 100 % (07/20 0622) Weight:  [96.571 kg (212 lb 14.4 oz)] 96.571 kg (212 lb 14.4 oz) (07/20 0622) Weight change:  Last BM Date: 04/18/14  Intake/Output from previous day: 07/19 0701 - 07/20 0700 In: 6045.8 [P.O.:1200; I.V.:4488.3; Blood:357.5] Out: -  Intake/Output this shift: Total I/O In: 1315 [I.V.:1315] Out: -   Physical Exam: General appearance the patient is alert and oriented  HEENT negative  Neck supple no JVD or thyroid abnormalities  Heart regular rhythm no murmurs  Lungs clear clear to P&A  Abdomen no palpable organs or tenderness  Skin warm and dry  Extremities free of edema   Recent Labs  04/16/14 2055 04/18/14 0728  WBC 6.2 4.6  HGB 5.2* 7.8*  HCT 18.3* 26.2*  PLT 334 287   BMET  Recent Labs  04/16/14 1852 04/17/14 0631  NA 137 138  K 4.8 4.1  CL 100 102  CO2 23 24  GLUCOSE 101* 104*  BUN 58* 48*  CREATININE 3.19* 2.61*  CALCIUM 9.3 9.1    Studies/Results: No results found.  Medications:  . ALPRAZolam  0.5 mg Oral Once  . aspirin EC  81 mg Oral Daily  . metoprolol succinate  100 mg Oral Daily  . simvastatin  40 mg Oral q1800  . sodium chloride  3 mL Intravenous Q12H    . sodium chloride 100 mL/hr at 04/18/14 1647  . sodium chloride       Assessment/Plan: 1. Anemia secondary to gastrointestinal bleeding. Plan to proceed today with EGD esophageal dilatation and  colonoscopy by gastroenterology service. Continue to monitor hemoglobin hematocrit and transfuse if needed  2. Hypotensive on admission but normotensive now with fluids  3. Chronic kidney disease stage II plan to continue to monitor  Hiatal hernia  5 coronary artery disease which is stable   LOS: 3 days   Huldah Marin G 04/19/2014, 6:26 AM

## 2014-04-19 NOTE — Progress Notes (Signed)
UR chart review completed.  

## 2014-04-20 LAB — TYPE AND SCREEN
ABO/RH(D): O POS
Antibody Screen: NEGATIVE
Unit division: 0
Unit division: 0
Unit division: 0
Unit division: 0

## 2014-04-20 MED ORDER — PANTOPRAZOLE SODIUM 40 MG PO TBEC: 40.0000 mg | DELAYED_RELEASE_TABLET | Freq: Two times a day (BID) | ORAL | Status: DC

## 2014-04-20 MED ORDER — METOPROLOL SUCCINATE ER 100 MG PO TB24: 100.0000 mg | ORAL_TABLET | Freq: Every day | ORAL | Status: DC

## 2014-04-20 NOTE — Progress Notes (Signed)
UR chart review completed.  

## 2014-04-20 NOTE — Progress Notes (Signed)
Patient being d/c home with instructions and prescriptions. IV cath removed and intact. No pain/swelling at the site. Family at bedside and no c/o pain at this time.

## 2014-04-20 NOTE — Discharge Summary (Signed)
Physician Discharge Summary  Jeremiah Calhoun BUL:[id number] DOB: March 10, 1951 DOA: 04/16/2014  PCP: Lanette Hampshire, MD  Admit date: 04/16/2014 Discharge date: 04/20/2014   Follow-up Information   Schedule an appointment as soon as possible for a visit with Lanette Hampshire, MD.   Specialty:  Family Medicine   Contact information:   [address] [place] [address] [phone]       Discharge Diagnoses:  Gastrointestinal bleeding with severe anemia positive stool internal hemorrhoids colon polyp esophageal stricture with distal esophageal erosions hiatal hernia multiple antral erosions 2. Coronary artery disease  3. Essential hypertension Discharge Condition: Stable Disposition: Home Diet recommendation: Low-cholesterol diet  Filed Weights   04/16/14 1826 04/16/14 2135 04/19/14 0622  Weight: 94.802 kg (209 lb) 97.977 kg (216 lb) 96.571 kg (212 lb 14.4 oz)    History of present illness:  The patient presented to the hospital with extreme weakness is noted have extremely low hemoglobin approximately 5 g. His had episode chest pain prior to admission as well but this subsided. He is known to have coronary artery disease which appears to be stable. Patient started on IV fluids and admitted to [facility] Course:  Shortly after the patient was admitted he was typed and crossmatched for 3 units of packed RBCs he was transfused with all 3 units. It is noted the quite iron deficient. He was also noted to have heme-positive stool. He was seen in consultation by cardiology service and was cleared to have endoscopy procedures by gastroenterology service this was accomplished the patient did have upper endoscopy and colonoscopy. Which he tolerated well. He was found to have esophageal stricture esophageal erosions gastric erosions and hiatal hernia. He is also noted to have internal hemorrhoids and polyp in intestine. The patient tolerated cc in satisfactory manner he was placed on  Protonix tolerated the procedures in satisfactory manner and was felt he be discharged to be followed up as an outpatient his to continue the medications listed below Discharge Instructions he is to continue medications listed below and return to primary care physician's office by appointment within the next week.     Medication List         amLODipine 10 MG tablet  Commonly known as:  NORVASC  Take 5 mg by mouth daily.     aspirin EC 325 MG tablet  Take 325 mg by mouth once as needed (for chest pain).     Co Q 10 100 MG Caps  Take 100 mg by mouth daily.     Fish Oil 1200 MG Caps  Take 1,200 mg by mouth daily.     Flax Seed Oil 1000 MG Caps  Take 1,000 mg by mouth daily.     lisinopril-hydrochlorothiazide 20-12.5 MG per tablet  Commonly known as:  PRINZIDE,ZESTORETIC  Take 2 tablets by mouth daily.     lisinopril-hydrochlorothiazide 20-12.5 MG per tablet  Commonly known as:  PRINZIDE,ZESTORETIC  TAKE 2 TABLETS BY MOUTH ONCE DAILY.     Lycopene 10 MG Caps  Take 10 mg by mouth daily.     metoprolol succinate 100 MG 24 hr tablet  Commonly known as:  TOPROL-XL  Take 1 tablet (100 mg total) by mouth daily. Take with or immediately following a meal.     metoprolol succinate 100 MG 24 hr tablet  Commonly known as:  TOPROL-XL  Take 1 tablet (100 mg total) by mouth daily. Take with or immediately following a meal.     multivitamin tablet  Take 1 tablet by mouth daily.     pantoprazole 40 MG tablet  Commonly known as:  PROTONIX  Take 1 tablet (40 mg total) by mouth 2 (two) times daily.     pravastatin 80 MG tablet  Commonly known as:  PRAVACHOL  Take 1 tablet (80 mg total) by mouth every evening.     saw palmetto 160 MG capsule  Take 160 mg by mouth daily.     spironolactone 25 MG tablet  Commonly known as:  ALDACTONE  Take 1 tablet (25 mg total) by mouth daily.     vitamin C 1000 MG tablet  Take 1,000 mg by mouth daily.       Allergies  Allergen Reactions  .  Codeine Nausea And Vomiting    The results of significant diagnostics from this hospitalization (including imaging, microbiology, ancillary and laboratory) are listed below for reference.    Significant Diagnostic Studies: Dg Chest 2 View  04/13/2014   CLINICAL DATA:  Pre cardiac catheterization, former smoking history, chest pain  EXAM: CHEST  2 VIEW  COMPARISON:  None.  FINDINGS: No active infiltrate or effusion is seen. Mediastinal and hilar contours are unremarkable. The heart is mildly enlarged. A moderate size hiatal hernia is noted. There are degenerative changes in the lower thoracic spine.  IMPRESSION: Mild cardiomegaly. No active lung disease. Moderate size hiatal hernia.   Electronically Signed   By: Ivar Drape M.D.   On: 04/13/2014 16:09   Dg Chest Port 1 View  04/16/2014   CLINICAL DATA:  Chest pain. History of myocardial infarction. Mild chronic renal insufficiency.  EXAM: PORTABLE CHEST - 1 VIEW  COMPARISON:  04/13/2014  FINDINGS: Borderline cardiomegaly. No edema. Moderate-sized hiatal hernia. No pleural effusion.  The lungs appear clear.  IMPRESSION: 1. Moderate-sized hiatal hernia. 2. Borderline cardiomegaly.   Electronically Signed   By: Sherryl Barters M.D.   On: 04/16/2014 18:45    Microbiology: No results found for this or any previous visit (from the past 240 hour(s)).   Labs: Basic Metabolic Panel:  Recent Labs Lab 04/16/14 1852 04/17/14 0631 04/19/14 0636  NA 137 138 142  K 4.8 4.1 4.1  CL 100 102 106  CO2 23 24 25   GLUCOSE 101* 104* 96  BUN 58* 48* 22  CREATININE 3.19* 2.61* 1.85*  CALCIUM 9.3 9.1 8.5   Liver Function Tests:  Recent Labs Lab 04/16/14 2010  AST 14  ALT 13  ALKPHOS 58  BILITOT 0.2*  PROT 6.3  ALBUMIN 3.4*   No results found for this basename: LIPASE, AMYLASE,  in the last 168 hours No results found for this basename: AMMONIA,  in the last 168 hours CBC:  Recent Labs Lab 04/16/14 1852 04/16/14 2055 04/18/14 0728  04/19/14 0636  WBC 6.1 6.2 4.6 5.4  HGB 5.1* 5.2* 7.8* 9.4*  HCT 18.2* 18.3* 26.2* 31.5*  MCV 66.4* 65.6* 72.0* 74.8*  PLT 322 334 287 318   Cardiac Enzymes:  Recent Labs Lab 04/16/14 1852 04/17/14 0227 04/17/14 0910  TROPONINI <0.30 <0.30 <0.30   BNP: BNP (last 3 results) No results found for this basename: PROBNP,  in the last 8760 hours CBG: No results found for this basename: GLUCAP,  in the last 168 hours  Active Problems:   Anemia   Time coordinating discharge: 30 minutes  Signed:  Marjean Donna, MD 04/20/2014, 6:21 AM

## 2014-04-20 NOTE — Care Management Note (Signed)
    Page 1 of 1   04/20/2014     8:16:21 AM CARE MANAGEMENT NOTE 04/20/2014  Patient:  Jeremiah Calhoun, Jeremiah Calhoun   Account Number:  [id number]  Date Initiated:  04/20/2014  Documentation initiated by:  Theophilus Kinds  Subjective/Objective Assessment:   Pt admitted from home with GI bleed. Pt  lives alone and is independent with ADL's. Pt has no insurance.     Action/Plan:   Consult sent to financial counselor to contact pt about hospital bill. No other CM needs noted. Pt discharge home today.   Anticipated DC Date:  04/20/2014   Anticipated DC Plan:  HOME/SELF CARE  In-house referral  Financial Counselor      DC Planning Services  CM consult      Choice offered to / List presented to:             Status of service:  Completed, signed off Medicare Important Message given?   (If response is "NO", the following Medicare IM given date fields will be blank) Date Medicare IM given:   Medicare IM given by:   Date Additional Medicare IM given:   Additional Medicare IM given by:    Discharge Disposition:  HOME/SELF CARE  Per UR Regulation:    If discussed at Long Length of Stay Meetings, dates discussed:    Comments:  04/20/14 0815 Christinia Gully, RN BSN CM

## 2014-04-21 ENCOUNTER — Encounter (HOSPITAL_COMMUNITY): Payer: Self-pay | Admitting: Internal Medicine

## 2014-04-21 ENCOUNTER — Telehealth: Payer: Self-pay | Admitting: *Deleted

## 2014-04-21 NOTE — Telephone Encounter (Signed)
Cath cancelled due to consult note per Dr Harrington Challenger. Pt made aware at hospital. Pt has f/u appt on 05/07/14 with Jory Sims, NP.

## 2014-04-22 ENCOUNTER — Encounter (HOSPITAL_COMMUNITY): Admission: RE | Payer: Self-pay | Source: Ambulatory Visit

## 2014-04-22 ENCOUNTER — Ambulatory Visit (HOSPITAL_COMMUNITY): Admission: RE | Admit: 2014-04-22 | Payer: Self-pay | Source: Ambulatory Visit | Admitting: Cardiovascular Disease

## 2014-04-22 SURGERY — LEFT HEART CATHETERIZATION WITH CORONARY ANGIOGRAM
Anesthesia: LOCAL

## 2014-04-25 ENCOUNTER — Encounter: Payer: Self-pay | Admitting: Internal Medicine

## 2014-04-27 ENCOUNTER — Telehealth: Payer: Self-pay

## 2014-04-27 DIAGNOSIS — D649 Anemia, unspecified: Secondary | ICD-10-CM

## 2014-04-27 NOTE — Telephone Encounter (Signed)
Letter from: Daneil Dolin Reason for Letter: Results Review  Send letter to patient.  Send copy of letter with path to referring provider and PCP.  Needs cbcand ov w extender in 6 weeks

## 2014-04-28 NOTE — Telephone Encounter (Signed)
Pt has OV on 9/8 at 9 with LSL

## 2014-04-28 NOTE — Telephone Encounter (Signed)
Lab order on file.  Jeremiah Calhoun, please schedule ov in 6 weeks. Letter mailed to pt.

## 2014-05-07 ENCOUNTER — Encounter: Payer: Self-pay | Admitting: Adult Health

## 2014-05-07 ENCOUNTER — Ambulatory Visit (INDEPENDENT_AMBULATORY_CARE_PROVIDER_SITE_OTHER): Payer: Self-pay | Admitting: Adult Health

## 2014-05-07 VITALS — BP 118/68 | HR 73 | Ht 69.0 in | Wt 222.0 lb

## 2014-05-07 DIAGNOSIS — R079 Chest pain, unspecified: Secondary | ICD-10-CM

## 2014-05-07 MED ORDER — METOPROLOL SUCCINATE ER 100 MG PO TB24: 100.0000 mg | ORAL_TABLET | Freq: Every day | ORAL | Status: DC

## 2014-05-07 MED ORDER — AMLODIPINE BESYLATE 10 MG PO TABS: 5.0000 mg | ORAL_TABLET | Freq: Every day | ORAL | Status: DC

## 2014-05-07 MED ORDER — SPIRONOLACTONE 25 MG PO TABS: 25.0000 mg | ORAL_TABLET | Freq: Every day | ORAL | Status: DC

## 2014-05-07 NOTE — Assessment & Plan Note (Signed)
Follow up with Dr. Sydell Axon as directed. Labs due on Monday

## 2014-05-07 NOTE — Assessment & Plan Note (Signed)
Follow up with PCP for ongoing labs. Continue statin therapy.

## 2014-05-07 NOTE — Progress Notes (Deleted)
Name: Jeremiah Calhoun    DOB: 04-25-1951  Age: 63 y.o.  MR#: [id number]       PCP:  Lanette Hampshire, MD      Insurance: Payor: MEDICAID POTENTIAL / Plan: MEDICAID POTENTIAL / Product Type: *No Product type* /   CC:    Chief Complaint  Patient presents with  . Coronary Artery Disease  . Hypertension    VS Filed Vitals:   05/07/14 1308  BP: 118/68  Pulse: 73  Height: 5\' 9"  (1.753 m)  Weight: 222 lb (100.699 kg)    Weights Current Weight  05/07/14 222 lb (100.699 kg)  04/19/14 212 lb 14.4 oz (96.571 kg)  04/19/14 212 lb 14.4 oz (96.571 kg)    Blood Pressure  BP Readings from Last 3 Encounters:  05/07/14 118/68  04/20/14 130/73  04/20/14 130/73     Admit date:  (Not on file) Last encounter with RMR:  04/13/2014   Allergy Codeine  Current Outpatient Prescriptions  Medication Sig Dispense Refill  . amLODipine (NORVASC) 10 MG tablet Take 5 mg by mouth daily.      . Ascorbic Acid (VITAMIN C) 1000 MG tablet Take 1,000 mg by mouth daily.      Marland Kitchen aspirin 81 MG tablet Take 81 mg by mouth daily.      . Coenzyme Q10 (CO Q 10) 100 MG CAPS Take 100 mg by mouth daily.      . ferrous sulfate 325 (65 FE) MG tablet Take 325 mg by mouth daily with breakfast. 2 tabs daily      . Flaxseed, Linseed, (FLAX SEED OIL) 1000 MG CAPS Take 1,000 mg by mouth daily.      Marland Kitchen lisinopril-hydrochlorothiazide (PRINZIDE,ZESTORETIC) 20-12.5 MG per tablet TAKE 2 TABLETS BY MOUTH ONCE DAILY.  60 tablet  11  . Lycopene 10 MG CAPS Take 10 mg by mouth daily.      . metoprolol succinate (TOPROL-XL) 100 MG 24 hr tablet Take 1 tablet (100 mg total) by mouth daily. Take with or immediately following a meal.  90 tablet  3  . Multiple Vitamin (MULTIVITAMIN) tablet Take 1 tablet by mouth daily.      . Omega-3 Fatty Acids (FISH OIL) 1200 MG CAPS Take 1,200 mg by mouth daily.      . pantoprazole (PROTONIX) 40 MG tablet Take 1 tablet (40 mg total) by mouth 2 (two) times daily.  60 tablet  5  . pravastatin (PRAVACHOL) 80 MG  tablet Take 1 tablet (80 mg total) by mouth every evening.  30 tablet  11  . saw palmetto 160 MG capsule Take 160 mg by mouth daily.      Marland Kitchen spironolactone (ALDACTONE) 25 MG tablet Take 1 tablet (25 mg total) by mouth daily.  90 tablet  3   No current facility-administered medications for this visit.    Discontinued Meds:    Medications Discontinued During This Encounter  Medication Reason  . aspirin EC 325 MG tablet Error  . lisinopril-hydrochlorothiazide (PRINZIDE,ZESTORETIC) 20-12.5 MG per tablet Error  . metoprolol succinate (TOPROL-XL) 100 MG 24 hr tablet Error    Patient Active Problem List   Diagnosis Date Noted  . Anemia 04/16/2014  . 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 Chest pain, exertional   . Hyperlipidemia   . Hypertension     LABS    Component Value Date/Time   NA 142 04/19/2014 0636   NA 138 04/17/2014 0631   NA 137 04/16/2014 1852   K  4.1 04/19/2014 0636   K 4.1 04/17/2014 0631   K 4.8 04/16/2014 1852   CL 106 04/19/2014 0636   CL 102 04/17/2014 0631   CL 100 04/16/2014 1852   CO2 25 04/19/2014 0636   CO2 24 04/17/2014 0631   CO2 23 04/16/2014 1852   GLUCOSE 96 04/19/2014 0636   GLUCOSE 104* 04/17/2014 0631   GLUCOSE 101* 04/16/2014 1852   BUN 22 04/19/2014 0636   BUN 48* 04/17/2014 0631   BUN 58* 04/16/2014 1852   CREATININE 1.85* 04/19/2014 0636   CREATININE 2.61* 04/17/2014 0631   CREATININE 3.19* 04/16/2014 1852   CREATININE 1.34 09/03/2012 0940   CREATININE 1.53* 07/23/2012 0850   CREATININE 1.43* 07/09/2012 1143   CALCIUM 8.5 04/19/2014 0636   CALCIUM 9.1 04/17/2014 0631   CALCIUM 9.3 04/16/2014 1852   GFRNONAA 37* 04/19/2014 0636   GFRNONAA 25* 04/17/2014 0631   GFRNONAA 19* 04/16/2014 1852   GFRAA 43* 04/19/2014 0636   GFRAA 29* 04/17/2014 0631   GFRAA 22* 04/16/2014 1852   CMP     Component Value Date/Time   NA 142 04/19/2014 0636   K 4.1 04/19/2014 0636   CL 106 04/19/2014 0636   CO2 25 04/19/2014 0636   GLUCOSE 96 04/19/2014 0636   BUN 22  04/19/2014 0636   CREATININE 1.85* 04/19/2014 0636   CREATININE 1.34 09/03/2012 0940   CALCIUM 8.5 04/19/2014 0636   PROT 6.3 04/16/2014 2010   ALBUMIN 3.4* 04/16/2014 2010   AST 14 04/16/2014 2010   ALT 13 04/16/2014 2010   ALKPHOS 58 04/16/2014 2010   BILITOT 0.2* 04/16/2014 2010   GFRNONAA 37* 04/19/2014 0636   GFRAA 43* 04/19/2014 0636       Component Value Date/Time   WBC 5.4 04/19/2014 0636   WBC 4.6 04/18/2014 0728   WBC 6.2 04/16/2014 2055   HGB 9.4* 04/19/2014 0636   HGB 7.8* 04/18/2014 0728   HGB 5.2* 04/16/2014 2055   HCT 31.5* 04/19/2014 0636   HCT 26.2* 04/18/2014 0728   HCT 18.3* 04/16/2014 2055   MCV 74.8* 04/19/2014 0636   MCV 72.0* 04/18/2014 0728   MCV 65.6* 04/16/2014 2055    Lipid Panel     Component Value Date/Time   CHOL 138 10/17/2012 1011   TRIG 155* 10/17/2012 1011   HDL 35* 10/17/2012 1011   CHOLHDL 3.9 10/17/2012 1011   VLDL 31 10/17/2012 1011   LDLCALC 72 10/17/2012 1011    ABG No results found for this basename: phart, pco2, pco2art, po2, po2art, hco3, tco2, acidbasedef, o2sat     No results found for this basename: TSH   BNP (last 3 results) No results found for this basename: PROBNP,  in the last 8760 hours Cardiac Panel (last 3 results) No results found for this basename: CKTOTAL, CKMB, TROPONINI, RELINDX,  in the last 72 hours  Iron/TIBC/Ferritin/ %Sat    Component Value Date/Time   IRON 535* 04/17/2014 0631   TIBC 921* 04/17/2014 0631   FERRITIN 2* 04/17/2014 0631   IRONPCTSAT 58* 04/17/2014 0631     EKG Orders placed during the hospital encounter of 04/16/14  . EKG 12-LEAD  . EKG 12-LEAD  . ED EKG  . ED EKG  . EKG 12-LEAD  . EKG 12-LEAD  . EKG     Prior Assessment and Plan Problem List as of 05/07/2014     Cardiovascular and Mediastinum   Hypertension   Last Assessment & Plan   04/13/2014 Office Visit Written 04/13/2014  4:27  PM by Lendon Colonel, NP     Blood pressure is low normal today at 102/58, this was rechecked and found to be  consistent with original blood pressure reading. I will decrease his amlodipine to 5 mg daily to avoid hypotension. Medication adjustments can be made after cardiac catheterization depending upon findings.      Nervous and Auditory   Hyperlipidemia   Last Assessment & Plan   11/28/2012 Office Visit Written 11/29/2012  4:37 PM by Yehuda Savannah, MD     Recent lipid profiles are excellent with current therapy, which will be continued.      Genitourinary   Chronic kidney disease, stage II (mild)   Last Assessment & Plan   04/13/2014 Office Visit Written 04/13/2014  4:28 PM by Lendon Colonel, NP     [place] labs will be completed. The patient is on spironolactone, he has been placed on this as a result of hypokalemia chronically, and was taken off a potassium supplement by Dr. Lattie Haw in the past. May need to remove spironolactone should his kidney function continued to be an issue and restart potassium supplements.      Other   Chest pain, exertional   Last Assessment & Plan   04/13/2014 Office Visit Written 04/13/2014  4:27 PM by Lendon Colonel, NP     The patient has had worsening symptoms of chest pressure, usually occurring with exertion, with bilateral arm heaviness, and worsening shortness of breath. This is being worsening since January, since he stopped smoking and recovered from the flu. However the symptoms have become worse over the last month. He states he cannot even take the garbage out without having to stop due to pressure in his chest and shortness of breath.  He had a low risk Cardiolite stress test in October of 2013, he did have some baseline ST segment depression and no exercise-related chest discomfort at that time scintigraphic imaging revealed possible mild scarring at the base of the inferior wall without evidence of ischemia. At that time he was deemed a low risk study.  I discussed the patient's case with Dr. Pennelope Bracken, including his symptoms and stress Myoview.  It is our recommendation that patient have cardiac catheterization for definitive evaluation of coronary anatomy, with possible PCI based upon results of angiography. Risks, benefits, and description of the procedure were completed with the patient who verbalizes understanding and is willing to proceed. He is planned for July 23 with Dr. Burt Knack.    Anemia       Imaging: Dg Chest 2 View  04/13/2014   CLINICAL DATA:  Pre cardiac catheterization, former smoking history, chest pain  EXAM: CHEST  2 VIEW  COMPARISON:  None.  FINDINGS: No active infiltrate or effusion is seen. Mediastinal and hilar contours are unremarkable. The heart is mildly enlarged. A moderate size hiatal hernia is noted. There are degenerative changes in the lower thoracic spine.  IMPRESSION: Mild cardiomegaly. No active lung disease. Moderate size hiatal hernia.   Electronically Signed   By: Ivar Drape M.D.   On: 04/13/2014 16:09   Dg Chest Port 1 View  04/16/2014   CLINICAL DATA:  Chest pain. History of myocardial infarction. Mild chronic renal insufficiency.  EXAM: PORTABLE CHEST - 1 VIEW  COMPARISON:  04/13/2014  FINDINGS: Borderline cardiomegaly. No edema. Moderate-sized hiatal hernia. No pleural effusion.  The lungs appear clear.  IMPRESSION: 1. Moderate-sized hiatal hernia. 2. Borderline cardiomegaly.   Electronically Signed   By: Sherryl Barters  M.D.   On: 04/16/2014 18:45

## 2014-05-07 NOTE — Patient Instructions (Addendum)
Your physician recommends that you schedule a follow-up appointment in: 6 months with Dr Koneswaran You will receive a reminder letter two months in advance reminding you to call and schedule your appointment. If you don't receive this letter, please contact our office.  Your physician recommends that you continue on your current medications as directed. Please refer to the Current Medication list given to you today.   

## 2014-05-07 NOTE — Assessment & Plan Note (Signed)
BP is controlled Will given him refills on amlodipine, metoprolol. See him in 6 months.

## 2014-05-07 NOTE — Assessment & Plan Note (Signed)
Completely resolved with resolution of bleeding and increase in Hgb. No planned cardiac testing at this time.

## 2014-05-07 NOTE — Progress Notes (Signed)
HPI: Jeremiah Calhoun is a 63 year old patient of Dr. Pennelope Bracken report for ongoing assessment and management of chest pain, hypertension, hyperlipidemia, chronic kidney disease. The patient had a nuclear medicine stress test this demonstrated significant stress-induced ischemia with EKG abnormalities in the setting of baseline ST segment depression with no exercise-related chest discomfort.  The patient unfortunately was admitted to [facility] in the setting of GI bleeding with severe anemia positive stool for acute large, internal hemorrhoids and colon polyp was noted. GI saw the patient he has esophageal stricture and distal esophageal erosions, hiatal hernia, and multiple antral erosions.  The patient was treated with Protonix, continued on metoprolol 100 mg daily lisinopril HCTZ, pravastatin, and spironolactone.  He is here for post hospitalization follow-up. I day of discharge, patient's hemoglobin was 9.4, with hematocrit of 31.5. Troponins are found be negative. Liver enzymes were normal. Creatinine on discharge from 25.  He is doing really well. Feels much better and is without complaints. He is to see Dr. Sydell Axon in a couple of weeks. He is more active. No chest discomfort.   Allergies  Allergen Reactions  . Codeine Nausea And Vomiting    Current Outpatient Prescriptions  Medication Sig Dispense Refill  . amLODipine (NORVASC) 10 MG tablet Take 5 mg by mouth daily.      . Ascorbic Acid (VITAMIN C) 1000 MG tablet Take 1,000 mg by mouth daily.      Marland Kitchen aspirin 81 MG tablet Take 81 mg by mouth daily.      . Coenzyme Q10 (CO Q 10) 100 MG CAPS Take 100 mg by mouth daily.      . ferrous sulfate 325 (65 FE) MG tablet Take 325 mg by mouth daily with breakfast. 2 tabs daily      . Flaxseed, Linseed, (FLAX SEED OIL) 1000 MG CAPS Take 1,000 mg by mouth daily.      Marland Kitchen lisinopril-hydrochlorothiazide (PRINZIDE,ZESTORETIC) 20-12.5 MG per tablet TAKE 2 TABLETS BY MOUTH ONCE DAILY.  60 tablet  11    . Lycopene 10 MG CAPS Take 10 mg by mouth daily.      . metoprolol succinate (TOPROL-XL) 100 MG 24 hr tablet Take 1 tablet (100 mg total) by mouth daily. Take with or immediately following a meal.  90 tablet  3  . Multiple Vitamin (MULTIVITAMIN) tablet Take 1 tablet by mouth daily.      . Omega-3 Fatty Acids (FISH OIL) 1200 MG CAPS Take 1,200 mg by mouth daily.      . pantoprazole (PROTONIX) 40 MG tablet Take 1 tablet (40 mg total) by mouth 2 (two) times daily.  60 tablet  5  . pravastatin (PRAVACHOL) 80 MG tablet Take 1 tablet (80 mg total) by mouth every evening.  30 tablet  11  . saw palmetto 160 MG capsule Take 160 mg by mouth daily.      Marland Kitchen spironolactone (ALDACTONE) 25 MG tablet Take 1 tablet (25 mg total) by mouth daily.  90 tablet  3   No current facility-administered medications for this visit.    Past Medical History  Diagnosis Date  . Hypertension   . Hyperlipidemia   . Chest pain, exertional   . Chronic kidney disease     kidney stones    Past Surgical History  Procedure Laterality Date  . Hernia repair      X2  . Esophagogastroduodenoscopy N/A 04/19/2014    Procedure: ESOPHAGOGASTRODUODENOSCOPY (EGD);  Surgeon: Daneil Dolin, MD;  Location: [facility];  Service: Endoscopy;  Laterality: N/A;  with possible esophageal dilation  . Colonoscopy N/A 04/19/2014    Procedure: COLONOSCOPY;  Surgeon: Daneil Dolin, MD;  Location: [facility];  Service: Endoscopy;  Laterality: N/A;    ROS: .NROS  PHYSICAL EXAM BP 118/68  Pulse 73  Ht 5\' 9"  (1.753 m)  Wt 222 lb (100.699 kg)  BMI 32.77 kg/m2 General: Well developed, well nourished, in no acute distress Head: Eyes PERRLA, No xanthomas.   Normal cephalic and atramatic  Lungs: Clear bilaterally to auscultation and percussion. Heart: HRRR S1 S2, without MRG.  Pulses are 2+ & equal.            No carotid bruit. No JVD.  No abdominal bruits. No femoral bruits. Abdomen: Bowel sounds are positive, abdomen soft and non-tender  without masses or                  Hernia's noted. Msk:  Back normal, normal gait. Normal strength and tone for age. Extremities: No clubbing, cyanosis or edema.  DP +1 Neuro: Alert and oriented X 3. Psych:  Good affect, responds appropriately  ASSESSMENT AND PLAN

## 2014-05-13 ENCOUNTER — Other Ambulatory Visit: Payer: Self-pay

## 2014-05-13 DIAGNOSIS — D649 Anemia, unspecified: Secondary | ICD-10-CM

## 2014-06-08 ENCOUNTER — Ambulatory Visit: Payer: Self-pay | Admitting: Gastroenterology

## 2014-08-09 ENCOUNTER — Other Ambulatory Visit: Payer: Self-pay | Admitting: Cardiovascular Disease

## 2015-03-07 ENCOUNTER — Other Ambulatory Visit: Payer: Self-pay | Admitting: Adult Health

## 2015-04-06 ENCOUNTER — Other Ambulatory Visit: Payer: Self-pay | Admitting: Adult Health

## 2015-05-04 ENCOUNTER — Other Ambulatory Visit: Payer: Self-pay | Admitting: Adult Health

## 2015-07-14 ENCOUNTER — Encounter: Payer: Self-pay | Admitting: Adult Health

## 2015-07-14 ENCOUNTER — Ambulatory Visit (INDEPENDENT_AMBULATORY_CARE_PROVIDER_SITE_OTHER): Payer: Self-pay | Admitting: Adult Health

## 2015-07-14 VITALS — BP 140/82 | HR 74 | Ht 69.0 in | Wt 231.1 lb

## 2015-07-14 DIAGNOSIS — D649 Anemia, unspecified: Secondary | ICD-10-CM

## 2015-07-14 DIAGNOSIS — R002 Palpitations: Secondary | ICD-10-CM

## 2015-07-14 DIAGNOSIS — I10 Essential (primary) hypertension: Secondary | ICD-10-CM

## 2015-07-14 LAB — CBC
HCT: 48.2 % (ref 39.0–52.0)
Hemoglobin: 16.7 g/dL (ref 13.0–17.0)
MCH: 30.7 pg (ref 26.0–34.0)
MCHC: 34.6 g/dL (ref 30.0–36.0)
MCV: 88.6 fL (ref 78.0–100.0)
MPV: 11.2 fL (ref 8.6–12.4)
Platelets: 224 10*3/uL (ref 150–400)
RBC: 5.44 MIL/uL (ref 4.22–5.81)
RDW: 14.3 % (ref 11.5–15.5)
WBC: 5.6 10*3/uL (ref 4.0–10.5)

## 2015-07-14 NOTE — Progress Notes (Signed)
Cardiology Office Note   Date:  07/14/2015   ID:  Jeremiah Calhoun, DOB 1951/03/28, MRN [id number]  PCP:  Lanette Hampshire, MD  Cardiologist: Woodroe Chen, NP   Chief Complaint  Patient presents with  . Hypertension  . Palpitations      History of Present Illness: Jeremiah Calhoun is a 64 y.o. male who presents for ongoing assessment and management of chest pain, hypertension, hyperlipidemia, chronic kidney disease. The patient had a nuclear medicine stress test this demonstrated significant stress-induced ischemia with EKG abnormalities in the setting of baseline ST segment depression with no exercise-related chest discomfort in 2015.  He is added on to my schedule due to complaints of rapid HR, chest pain dizziness, weakness and hypertension. Was advised to go to ER but did not want to have an ER charge.   He is feeling much better.  Right now, but is concerned about the episode that occurred yesterday morning.  He states that he had come down from a deer stand and was carrying wood into the house when he felt his heart rate racing and he felt weakness.  He checked his blood pressure and it was elevated to 186/103.  He states that he took 2 aspirin and relaxed and felt better.  The following day he again had some elevated blood pressure and "a funny feeling in his chest."  He took 2 aspirin and felt better.  The patient did not have a cardiac catheterization based upon the nuclear stress test.  That was done in 2013.     Abnormal stress nuclear myocardial study revealing somewhat impaired exercise capacity, mild left ventricular enlargement, mildly impaired left ventricular systolic function in a segmental pattern and significant stress-induced ischemic EKG abnormalities in the setting of baseline ST-segment depression and no exercise related chest discomfort. By scintigraphic imaging, there was possible mild scarring at the base of the inferior wall without evidence for  ischemia. Other findings as noted. These findings suggest a low risk for significant cardiovascular events in the near and intermediate term.  He has gained some weight because he quit smoking a couple years ago.  He denies any fluid retention.  He denies exertional shortness of breath. Past Medical History  Diagnosis Date  . Hypertension   . Hyperlipidemia   . Chest pain, exertional   . Chronic kidney disease     kidney stones    Past Surgical History  Procedure Laterality Date  . Hernia repair      X2  . Esophagogastroduodenoscopy N/A 04/19/2014    PNT:IRWERXV reflux esophagitis - benign-appearing peptic stricture  -  status post dilation as described above. 5 cm hiatal hernia. Gastric erosions.    Status post [place] dilation and gastric biopsy  . Colonoscopy N/A 04/19/2014    QMG:[id number] hemorrhoids. Colonic diverticulosis. Single colonic polyp removed as described above     Current Outpatient Prescriptions  Medication Sig Dispense Refill  . amLODipine (NORVASC) 10 MG tablet TAKE 1/2 TABLET BY MOUTH DAILY. 30 tablet 3  . Ascorbic Acid (VITAMIN C) 1000 MG tablet Take 1,000 mg by mouth daily.    Marland Kitchen aspirin 81 MG tablet Take 81 mg by mouth daily.    . Coenzyme Q10 (CO Q 10) 100 MG CAPS Take 100 mg by mouth daily.    . ferrous sulfate 325 (65 FE) MG tablet Take 325 mg by mouth daily with breakfast. 2 tabs daily    . Flaxseed, Linseed, (FLAX SEED OIL) 1000 MG CAPS Take  1,000 mg by mouth daily.    Marland Kitchen lisinopril-hydrochlorothiazide (PRINZIDE,ZESTORETIC) 20-12.5 MG per tablet TAKE 2 TABLETS BY MOUTH ONCE DAILY. 60 tablet 6  . Lycopene 10 MG CAPS Take 10 mg by mouth daily.    . metoprolol succinate (TOPROL-XL) 100 MG 24 hr tablet TAKE ONE TABLET BY MOUTH DAILY. TAKE WITH OR IMMEDIATELY FOLLOWING A MEAL. 90 tablet 3  . Multiple Vitamin (MULTIVITAMIN) tablet Take 1 tablet by mouth daily.    . Omega-3 Fatty Acids (FISH OIL) 1200 MG CAPS Take 1,200 mg by mouth daily.    . pantoprazole  (PROTONIX) 40 MG tablet Take 1 tablet (40 mg total) by mouth 2 (two) times daily. 60 tablet 5  . pravastatin (PRAVACHOL) 80 MG tablet Take 1 tablet (80 mg total) by mouth every evening. 30 tablet 11  . saw palmetto 160 MG capsule Take 160 mg by mouth daily.    Marland Kitchen spironolactone (ALDACTONE) 25 MG tablet TAKE ONE TABLET BY MOUTH DAILY. 90 tablet 3   No current facility-administered medications for this visit.    Allergies:   Codeine    Social History:  The patient  reports that he quit smoking about 21 months ago. His smoking use included Cigarettes. He has a 12 pack-year smoking history. He does not have any smokeless tobacco history on file. He reports that he drinks alcohol. He reports that he does not use illicit drugs.   Family History:  The patient's family history includes Heart attack (age of onset: 61) in his father; Heart attack (age of onset: 25) in his mother; Hypertension in his sister.    ROS: All other systems are reviewed and negative. Unless otherwise mentioned in H&P    PHYSICAL EXAM: VS:  BP 140/82 mmHg  Pulse 74  Ht 5\' 9"  (1.753 m)  Wt 231 lb 1.6 oz (104.826 kg)  BMI 34.11 kg/m2  SpO2 95% , BMI Body mass index is 34.11 kg/(m^2). GEN: Well nourished, well developed, in no acute distress HEENT: normal Neck: no JVD, carotid bruits, or masses Cardiac: RRR; no murmurs, rubs, or gallops,no edema  Respiratory:  clear to auscultation bilaterally, normal work of breathing GI: soft, nontender, nondistended, + BS MS: no deformity or atrophy Skin: warm and dry, no rash Neuro:  Strength and sensation are intact Psych: euthymic mood, full affect   EKG:  The ekg ordered today demonstrates normal sinus rhythm, rate of 72 beats per minute, right axis deviation is noted.   Recent Labs: No results found for requested labs within last 365 days.    Lipid Panel    Component Value Date/Time   CHOL 138 10/17/2012 1011   TRIG 155* 10/17/2012 1011   HDL 35* 10/17/2012 1011    CHOLHDL 3.9 10/17/2012 1011   VLDL 31 10/17/2012 1011   LDLCALC 72 10/17/2012 1011      Wt Readings from Last 3 Encounters:  07/14/15 231 lb 1.6 oz (104.826 kg)  05/07/14 222 lb (100.699 kg)  04/19/14 212 lb 14.4 oz (96.571 kg)    ASSESSMENT AND PLAN:  1.  Rapid heart rate: This occurred while he was carrying heavy wood after climbing down from a deer stand.  The patient states he felt better after he took a couple of aspirin.  He has been medically compliant.  He is on metoprolol 100 mg daily.  Did not miss any doses.  I will check an echocardiogram to evaluate for changes in LV function.  We will also check a BMET to evaluate potassium  status, and magnesium.  2. Hypertension: patient reported elevated with rapid heart rate.  He states that he felt better once he took aspirin and blood pressure became better controlled.  I will continue him on 5 mg of amlodipine for now.  He is also to continue lisinopril, HCTZ, 20/12.5 mg 2 tablets daily.  I will check a BMET to evaluate his potassium status and kidney function.  He will bring his blood pressure cuff with him on next office visit to correlate. May consider repeating stress test as one has not been done since 2013 with cardiovascular risk factors to include hypertension, hypercholesterolemia, former smoking history.  3. History of anemia:I will check a CBC to evaluate further to investigate it anemia is causing a rapid heart rhythm, questionable paroxysmal atrial fibrillation.    Current medicines are reviewed at length with the patient today.    Labs/ tests ordered today include: CBC, CMET, magnesium, echocardiogram  Orders Placed This Encounter  Procedures  . EKG 12-Lead     Disposition:   FU with 1-2 weeks to discuss test results.   Signed, Jory Sims, NP  07/14/2015 3:57 PM    [facility] [address], [place], [place] [address] Phone: [phone]; Fax: [phone]

## 2015-07-14 NOTE — Progress Notes (Deleted)
Name: Jeremiah Calhoun    DOB: Mar 16, 1951  Age: 64 y.o.  MR#: [id number]       PCP:  Lanette Hampshire, MD      Insurance: Payor: / No coverage found.  CC:   No chief complaint on file.   VS Filed Vitals:   07/14/15 1523  BP: 140/82  Pulse: 74  Height: 5\' 9"  (1.753 m)  Weight: 231 lb 1.6 oz (104.826 kg)  SpO2: 95%    Weights Current Weight  07/14/15 231 lb 1.6 oz (104.826 kg)  05/07/14 222 lb (100.699 kg)  04/19/14 212 lb 14.4 oz (96.571 kg)    Blood Pressure  BP Readings from Last 3 Encounters:  07/14/15 140/82  05/07/14 118/68  04/20/14 130/73     Admit date:  (Not on file) Last encounter with RMR:  05/04/2015   Allergy Codeine  Current Outpatient Prescriptions  Medication Sig Dispense Refill  . amLODipine (NORVASC) 10 MG tablet TAKE 1/2 TABLET BY MOUTH DAILY. 30 tablet 3  . Ascorbic Acid (VITAMIN C) 1000 MG tablet Take 1,000 mg by mouth daily.    Marland Kitchen aspirin 81 MG tablet Take 81 mg by mouth daily.    . Coenzyme Q10 (CO Q 10) 100 MG CAPS Take 100 mg by mouth daily.    . ferrous sulfate 325 (65 FE) MG tablet Take 325 mg by mouth daily with breakfast. 2 tabs daily    . Flaxseed, Linseed, (FLAX SEED OIL) 1000 MG CAPS Take 1,000 mg by mouth daily.    Marland Kitchen lisinopril-hydrochlorothiazide (PRINZIDE,ZESTORETIC) 20-12.5 MG per tablet TAKE 2 TABLETS BY MOUTH ONCE DAILY. 60 tablet 6  . Lycopene 10 MG CAPS Take 10 mg by mouth daily.    . metoprolol succinate (TOPROL-XL) 100 MG 24 hr tablet TAKE ONE TABLET BY MOUTH DAILY. TAKE WITH OR IMMEDIATELY FOLLOWING A MEAL. 90 tablet 3  . Multiple Vitamin (MULTIVITAMIN) tablet Take 1 tablet by mouth daily.    . Omega-3 Fatty Acids (FISH OIL) 1200 MG CAPS Take 1,200 mg by mouth daily.    . pantoprazole (PROTONIX) 40 MG tablet Take 1 tablet (40 mg total) by mouth 2 (two) times daily. 60 tablet 5  . pravastatin (PRAVACHOL) 80 MG tablet Take 1 tablet (80 mg total) by mouth every evening. 30 tablet 11  . saw palmetto 160 MG capsule Take 160 mg by mouth  daily.    Marland Kitchen spironolactone (ALDACTONE) 25 MG tablet TAKE ONE TABLET BY MOUTH DAILY. 90 tablet 3   No current facility-administered medications for this visit.    Discontinued Meds:   There are no discontinued medications.  Patient Active Problem List   Diagnosis Date Noted  . Anemia 04/16/2014  . 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 Chest pain, exertional   . Hyperlipidemia   . Hypertension     LABS    Component Value Date/Time   NA 142 04/19/2014 0636   NA 138 04/17/2014 0631   NA 137 04/16/2014 1852   K 4.1 04/19/2014 0636   K 4.1 04/17/2014 0631   K 4.8 04/16/2014 1852   CL 106 04/19/2014 0636   CL 102 04/17/2014 0631   CL 100 04/16/2014 1852   CO2 25 04/19/2014 0636   CO2 24 04/17/2014 0631   CO2 23 04/16/2014 1852   GLUCOSE 96 04/19/2014 0636   GLUCOSE 104* 04/17/2014 0631   GLUCOSE 101* 04/16/2014 1852   BUN 22 04/19/2014 0636   BUN 48* 04/17/2014 0631   BUN 58*  04/16/2014 1852   CREATININE 1.85* 04/19/2014 0636   CREATININE 2.61* 04/17/2014 0631   CREATININE 3.19* 04/16/2014 1852   CREATININE 1.34 09/03/2012 0940   CREATININE 1.53* 07/23/2012 0850   CREATININE 1.43* 07/09/2012 1143   CALCIUM 8.5 04/19/2014 0636   CALCIUM 9.1 04/17/2014 0631   CALCIUM 9.3 04/16/2014 1852   GFRNONAA 37* 04/19/2014 0636   GFRNONAA 25* 04/17/2014 0631   GFRNONAA 19* 04/16/2014 1852   GFRAA 43* 04/19/2014 0636   GFRAA 29* 04/17/2014 0631   GFRAA 22* 04/16/2014 1852   CMP     Component Value Date/Time   NA 142 04/19/2014 0636   K 4.1 04/19/2014 0636   CL 106 04/19/2014 0636   CO2 25 04/19/2014 0636   GLUCOSE 96 04/19/2014 0636   BUN 22 04/19/2014 0636   CREATININE 1.85* 04/19/2014 0636   CREATININE 1.34 09/03/2012 0940   CALCIUM 8.5 04/19/2014 0636   PROT 6.3 04/16/2014 2010   ALBUMIN 3.4* 04/16/2014 2010   AST 14 04/16/2014 2010   ALT 13 04/16/2014 2010   ALKPHOS 58 04/16/2014 2010   BILITOT 0.2* 04/16/2014 2010   GFRNONAA 37* 04/19/2014 0636    GFRAA 43* 04/19/2014 0636       Component Value Date/Time   WBC 5.4 04/19/2014 0636   WBC 4.6 04/18/2014 0728   WBC 6.2 04/16/2014 2055   HGB 9.4* 04/19/2014 0636   HGB 7.8* 04/18/2014 0728   HGB 5.2* 04/16/2014 2055   HCT 31.5* 04/19/2014 0636   HCT 26.2* 04/18/2014 0728   HCT 18.3* 04/16/2014 2055   MCV 74.8* 04/19/2014 0636   MCV 72.0* 04/18/2014 0728   MCV 65.6* 04/16/2014 2055    Lipid Panel     Component Value Date/Time   CHOL 138 10/17/2012 1011   TRIG 155* 10/17/2012 1011   HDL 35* 10/17/2012 1011   CHOLHDL 3.9 10/17/2012 1011   VLDL 31 10/17/2012 1011   LDLCALC 72 10/17/2012 1011    ABG No results found for: PHART, PCO2ART, PO2ART, HCO3, TCO2, ACIDBASEDEF, O2SAT   No results found for: TSH BNP (last 3 results) No results for input(s): BNP in the last 8760 hours.  ProBNP (last 3 results) No results for input(s): PROBNP in the last 8760 hours.  Cardiac Panel (last 3 results) No results for input(s): CKTOTAL, CKMB, TROPONINI, RELINDX in the last 72 hours.  Iron/TIBC/Ferritin/ %Sat    Component Value Date/Time   IRON 535* 04/17/2014 0631   TIBC 921* 04/17/2014 0631   FERRITIN 2* 04/17/2014 0631   IRONPCTSAT 58* 04/17/2014 0631     EKG Orders placed or performed in visit on 07/14/15  . EKG 12-Lead     Prior Assessment and Plan Problem List as of 07/14/2015      Cardiovascular and Mediastinum   Hypertension   Last Assessment & Plan 05/07/2014 Office Visit Written 05/07/2014  1:36 PM by Lendon Colonel, NP    BP is controlled Will given him refills on amlodipine, metoprolol. See him in 6 months.         Nervous and Auditory   Hyperlipidemia   Last Assessment & Plan 05/07/2014 Office Visit Written 05/07/2014  1:35 PM by Lendon Colonel, NP    Follow up with PCP for ongoing labs. Continue statin therapy.        Genitourinary   Chronic kidney disease, stage II (mild)   Last Assessment & Plan 04/13/2014 Office Visit Written 04/13/2014  4:28 PM by  Lendon Colonel, NP  [place] labs will be completed. The patient is on spironolactone, he has been placed on this as a result of hypokalemia chronically, and was taken off a potassium supplement by Dr. Lattie Haw in the past. May need to remove spironolactone should his kidney function continued to be an issue and restart potassium supplements.        Other   Chest pain, exertional   Last Assessment & Plan 05/07/2014 Office Visit Written 05/07/2014  1:34 PM by Lendon Colonel, NP    Completely resolved with resolution of bleeding and increase in Hgb. No planned cardiac testing at this time.       Anemia   Last Assessment & Plan 05/07/2014 Office Visit Written 05/07/2014  1:38 PM by Lendon Colonel, NP    Follow up with Dr. Sydell Axon as directed. Labs due on Monday          Imaging: No results found.

## 2015-07-14 NOTE — Patient Instructions (Signed)
Your physician recommends that you schedule a follow-up appointment in: 1 week with K.Lawrence NP   Lab work today   Echo tomorrow  Bring BP cuff to visit     Thank you for choosing [facility] !

## 2015-07-15 ENCOUNTER — Ambulatory Visit (HOSPITAL_COMMUNITY)
Admission: RE | Admit: 2015-07-15 | Discharge: 2015-07-15 | Disposition: A | Discharge status: Home / Self Care | Payer: Self-pay | Source: Ambulatory Visit | Attending: Adult Health | Admitting: Adult Health

## 2015-07-15 ENCOUNTER — Telehealth: Payer: Self-pay

## 2015-07-15 DIAGNOSIS — R002 Palpitations: Secondary | ICD-10-CM | POA: Insufficient documentation

## 2015-07-15 DIAGNOSIS — I10 Essential (primary) hypertension: Secondary | ICD-10-CM | POA: Insufficient documentation

## 2015-07-15 DIAGNOSIS — D649 Anemia, unspecified: Secondary | ICD-10-CM | POA: Insufficient documentation

## 2015-07-15 LAB — COMPREHENSIVE METABOLIC PANEL
ALT: 18 U/L (ref 9–46)
AST: 16 U/L (ref 10–35)
Albumin: 3.9 g/dL (ref 3.6–5.1)
Alkaline Phosphatase: 93 U/L (ref 40–115)
BUN: 21 mg/dL (ref 7–25)
CO2: 29 mmol/L (ref 20–31)
Calcium: 9.5 mg/dL (ref 8.6–10.3)
Chloride: 99 mmol/L (ref 98–110)
Creat: 1.42 mg/dL — ABNORMAL HIGH (ref 0.70–1.25)
Glucose, Bld: 89 mg/dL (ref 65–99)
Potassium: 3.3 mmol/L — ABNORMAL LOW (ref 3.5–5.3)
Sodium: 140 mmol/L (ref 135–146)
Total Bilirubin: 0.6 mg/dL (ref 0.2–1.2)
Total Protein: 6.3 g/dL (ref 6.1–8.1)

## 2015-07-15 LAB — MAGNESIUM: Magnesium: 2 mg/dL (ref 1.5–2.5)

## 2015-07-15 MED ORDER — POTASSIUM CHLORIDE CRYS ER 20 MEQ PO TBCR: EXTENDED_RELEASE_TABLET | ORAL | Status: DC

## 2015-07-15 NOTE — Telephone Encounter (Deleted)
Left message for patient to call back,e-scribed lasix to the pharmacy

## 2015-07-15 NOTE — Telephone Encounter (Signed)
-----   Message from Lendon Colonel, NP sent at 07/15/2015 11:49 AM EDT ----- Potassium is low at 3.3. He will need replacement. Send him Rx for 20 mEq daily. He is to take 40 mEq today and tomorrow and then 20 mEq for the next two days there after. Increase fluids for a few days. Renal function better than last check one year ago. Awaiting echo results.

## 2015-07-15 NOTE — Telephone Encounter (Signed)
Left message for pt to call back.e-scribed potassium rx to pharmacy

## 2015-07-21 ENCOUNTER — Encounter: Payer: Self-pay | Admitting: Adult Health

## 2015-07-21 MED ORDER — POTASSIUM CHLORIDE CRYS ER 20 MEQ PO TBCR: 20.0000 meq | EXTENDED_RELEASE_TABLET | Freq: Every day | ORAL | Status: DC

## 2015-07-21 MED ORDER — POTASSIUM CHLORIDE CRYS ER 20 MEQ PO TBCR: 10.0000 meq | EXTENDED_RELEASE_TABLET | Freq: Every day | ORAL | Status: DC

## 2015-07-21 NOTE — Progress Notes (Signed)
Name: Jeremiah Calhoun    DOB: 28-May-1951  Age: 64 y.o.  MR#: [id number]       PCP:  Lanette Hampshire, MD      Insurance: Payor: / No coverage found.  CC:   No chief complaint on file.   VS Filed Vitals:   07/21/15 1511  BP: 140/80  Pulse: 86  Weight: 232 lb (105.235 kg)  SpO2: 95%    Weights Current Weight  07/21/15 232 lb (105.235 kg)  07/14/15 231 lb 1.6 oz (104.826 kg)  05/07/14 222 lb (100.699 kg)    Blood Pressure  BP Readings from Last 3 Encounters:  07/21/15 140/80  07/14/15 140/82  05/07/14 118/68     Admit date:  (Not on file) Last encounter with RMR:  07/14/2015   Allergy Codeine  Current Outpatient Prescriptions  Medication Sig Dispense Refill  . amLODipine (NORVASC) 10 MG tablet TAKE 1/2 TABLET BY MOUTH DAILY. 30 tablet 3  . Ascorbic Acid (VITAMIN C) 1000 MG tablet Take 1,000 mg by mouth daily.    Marland Kitchen aspirin 81 MG tablet Take 81 mg by mouth daily.    . Coenzyme Q10 (CO Q 10) 100 MG CAPS Take 100 mg by mouth daily.    . ferrous sulfate 325 (65 FE) MG tablet Take 325 mg by mouth daily with breakfast. 2 tabs daily    . Flaxseed, Linseed, (FLAX SEED OIL) 1000 MG CAPS Take 1,000 mg by mouth daily.    Marland Kitchen lisinopril-hydrochlorothiazide (PRINZIDE,ZESTORETIC) 20-12.5 MG per tablet TAKE 2 TABLETS BY MOUTH ONCE DAILY. 60 tablet 6  . Lycopene 10 MG CAPS Take 10 mg by mouth daily.    . metoprolol succinate (TOPROL-XL) 100 MG 24 hr tablet TAKE ONE TABLET BY MOUTH DAILY. TAKE WITH OR IMMEDIATELY FOLLOWING A MEAL. 90 tablet 3  . Multiple Vitamin (MULTIVITAMIN) tablet Take 1 tablet by mouth daily.    . Omega-3 Fatty Acids (FISH OIL) 1200 MG CAPS Take 1,200 mg by mouth daily.    . pantoprazole (PROTONIX) 40 MG tablet Take 1 tablet (40 mg total) by mouth 2 (two) times daily. 60 tablet 5  . potassium chloride SA (K-DUR,KLOR-CON) 20 MEQ tablet Take 40 meq for the FIRST 2 DAYS, THEN take 20 meq daily 90 tablet 3  . pravastatin (PRAVACHOL) 80 MG tablet Take 1 tablet (80 mg total) by  mouth every evening. 30 tablet 11  . saw palmetto 160 MG capsule Take 160 mg by mouth daily.    Marland Kitchen spironolactone (ALDACTONE) 25 MG tablet TAKE ONE TABLET BY MOUTH DAILY. 90 tablet 3   No current facility-administered medications for this visit.    Discontinued Meds:   There are no discontinued medications.  Patient Active Problem List   Diagnosis Date Noted  . Anemia 04/16/2014  . 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 Chest pain, exertional   . Hyperlipidemia   . Hypertension     LABS    Component Value Date/Time   NA 140 07/14/2015 1628   NA 142 04/19/2014 0636   NA 138 04/17/2014 0631   K 3.3* 07/14/2015 1628   K 4.1 04/19/2014 0636   K 4.1 04/17/2014 0631   CL 99 07/14/2015 1628   CL 106 04/19/2014 0636   CL 102 04/17/2014 0631   CO2 29 07/14/2015 1628   CO2 25 04/19/2014 0636   CO2 24 04/17/2014 0631   GLUCOSE 89 07/14/2015 1628   GLUCOSE 96 04/19/2014 0636   GLUCOSE 104* 04/17/2014 0631  BUN 21 07/14/2015 1628   BUN 22 04/19/2014 0636   BUN 48* 04/17/2014 0631   CREATININE 1.42* 07/14/2015 1628   CREATININE 1.85* 04/19/2014 0636   CREATININE 2.61* 04/17/2014 0631   CREATININE 3.19* 04/16/2014 1852   CREATININE 1.34 09/03/2012 0940   CREATININE 1.53* 07/23/2012 0850   CALCIUM 9.5 07/14/2015 1628   CALCIUM 8.5 04/19/2014 0636   CALCIUM 9.1 04/17/2014 0631   GFRNONAA 37* 04/19/2014 0636   GFRNONAA 25* 04/17/2014 0631   GFRNONAA 19* 04/16/2014 1852   GFRAA 43* 04/19/2014 0636   GFRAA 29* 04/17/2014 0631   GFRAA 22* 04/16/2014 1852   CMP     Component Value Date/Time   NA 140 07/14/2015 1628   K 3.3* 07/14/2015 1628   CL 99 07/14/2015 1628   CO2 29 07/14/2015 1628   GLUCOSE 89 07/14/2015 1628   BUN 21 07/14/2015 1628   CREATININE 1.42* 07/14/2015 1628   CREATININE 1.85* 04/19/2014 0636   CALCIUM 9.5 07/14/2015 1628   PROT 6.3 07/14/2015 1628   ALBUMIN 3.9 07/14/2015 1628   AST 16 07/14/2015 1628   ALT 18 07/14/2015 1628    ALKPHOS 93 07/14/2015 1628   BILITOT 0.6 07/14/2015 1628   GFRNONAA 37* 04/19/2014 0636   GFRAA 43* 04/19/2014 0636       Component Value Date/Time   WBC 5.6 07/14/2015 1628   WBC 5.4 04/19/2014 0636   WBC 4.6 04/18/2014 0728   HGB 16.7 07/14/2015 1628   HGB 9.4* 04/19/2014 0636   HGB 7.8* 04/18/2014 0728   HCT 48.2 07/14/2015 1628   HCT 31.5* 04/19/2014 0636   HCT 26.2* 04/18/2014 0728   MCV 88.6 07/14/2015 1628   MCV 74.8* 04/19/2014 0636   MCV 72.0* 04/18/2014 0728    Lipid Panel     Component Value Date/Time   CHOL 138 10/17/2012 1011   TRIG 155* 10/17/2012 1011   HDL 35* 10/17/2012 1011   CHOLHDL 3.9 10/17/2012 1011   VLDL 31 10/17/2012 1011   LDLCALC 72 10/17/2012 1011    ABG No results found for: PHART, PCO2ART, PO2ART, HCO3, TCO2, ACIDBASEDEF, O2SAT   No results found for: TSH BNP (last 3 results) No results for input(s): BNP in the last 8760 hours.  ProBNP (last 3 results) No results for input(s): PROBNP in the last 8760 hours.  Cardiac Panel (last 3 results) No results for input(s): CKTOTAL, CKMB, TROPONINI, RELINDX in the last 72 hours.  Iron/TIBC/Ferritin/ %Sat    Component Value Date/Time   IRON 535* 04/17/2014 0631   TIBC 921* 04/17/2014 0631   FERRITIN 2* 04/17/2014 0631   IRONPCTSAT 58* 04/17/2014 0631     EKG Orders placed or performed in visit on 07/14/15  . EKG 12-Lead     Prior Assessment and Plan Problem List as of 07/21/2015      Cardiovascular and Mediastinum   Hypertension   Last Assessment & Plan 05/07/2014 Office Visit Written 05/07/2014  1:36 PM by Lendon Colonel, NP    BP is controlled Will given him refills on amlodipine, metoprolol. See him in 6 months.         Nervous and Auditory   Hyperlipidemia   Last Assessment & Plan 05/07/2014 Office Visit Written 05/07/2014  1:35 PM by Lendon Colonel, NP    Follow up with PCP for ongoing labs. Continue statin therapy.        Genitourinary   Chronic kidney disease, stage  II (mild)   Last Assessment & Plan 04/13/2014 Office  Visit Written 04/13/2014  4:28 PM by Lendon Colonel, NP    [place] labs will be completed. The patient is on spironolactone, he has been placed on this as a result of hypokalemia chronically, and was taken off a potassium supplement by Dr. Lattie Haw in the past. May need to remove spironolactone should his kidney function continued to be an issue and restart potassium supplements.        Other   Chest pain, exertional   Last Assessment & Plan 05/07/2014 Office Visit Written 05/07/2014  1:34 PM by Lendon Colonel, NP    Completely resolved with resolution of bleeding and increase in Hgb. No planned cardiac testing at this time.       Anemia   Last Assessment & Plan 05/07/2014 Office Visit Written 05/07/2014  1:38 PM by Lendon Colonel, NP    Follow up with Dr. Sydell Axon as directed. Labs due on Monday          Imaging: No results found.

## 2015-07-21 NOTE — Progress Notes (Signed)
Cardiology Office Note   Date:  07/21/2015   ID:  Jeremiah Calhoun, DOB Sep 07, 1951, MRN [id number]  PCP:  Lanette Hampshire, MD  Cardiologist:   Jory Sims, NP   No chief complaint on file.   ERROR

## 2015-07-21 NOTE — Patient Instructions (Addendum)
Your physician wants you to follow-up in: 6 months with Jory Sims, NP. You will receive a reminder letter in the mail two months in advance. If you don't receive a letter, please call our office to schedule the follow-up appointment.  Your physician recommends that you continue on your current medications as directed. Please refer to the Current Medication list given to you today.  If you need a refill on your cardiac medications before your next appointment, please call your pharmacy.  Thank you for choosing [facility]!

## 2015-07-21 NOTE — Progress Notes (Signed)
Name: Jeremiah Calhoun    DOB: 09/13/1951  Age: 64 y.o.  MR#: [id number]       PCP:  Lanette Hampshire, MD      Insurance: Payor: / No coverage found.  CC:   No chief complaint on file.   VS Filed Vitals:   07/21/15 1511  BP: 140/80  Pulse: 86  Weight: 232 lb (105.235 kg)  SpO2: 95%    Weights Current Weight  07/21/15 232 lb (105.235 kg)  07/14/15 231 lb 1.6 oz (104.826 kg)  05/07/14 222 lb (100.699 kg)    Blood Pressure  BP Readings from Last 3 Encounters:  07/21/15 140/80  07/14/15 140/82  05/07/14 118/68     Admit date:  (Not on file) Last encounter with RMR:  07/14/2015   Allergy Codeine  Current Outpatient Prescriptions  Medication Sig Dispense Refill  . amLODipine (NORVASC) 10 MG tablet TAKE 1/2 TABLET BY MOUTH DAILY. 30 tablet 3  . Ascorbic Acid (VITAMIN C) 1000 MG tablet Take 1,000 mg by mouth daily.    Marland Kitchen aspirin 81 MG tablet Take 81 mg by mouth daily.    . Coenzyme Q10 (CO Q 10) 100 MG CAPS Take 100 mg by mouth daily.    . ferrous sulfate 325 (65 FE) MG tablet Take 325 mg by mouth daily with breakfast. 2 tabs daily    . Flaxseed, Linseed, (FLAX SEED OIL) 1000 MG CAPS Take 1,000 mg by mouth daily.    Marland Kitchen lisinopril-hydrochlorothiazide (PRINZIDE,ZESTORETIC) 20-12.5 MG per tablet TAKE 2 TABLETS BY MOUTH ONCE DAILY. 60 tablet 6  . Lycopene 10 MG CAPS Take 10 mg by mouth daily.    . metoprolol succinate (TOPROL-XL) 100 MG 24 hr tablet TAKE ONE TABLET BY MOUTH DAILY. TAKE WITH OR IMMEDIATELY FOLLOWING A MEAL. 90 tablet 3  . Multiple Vitamin (MULTIVITAMIN) tablet Take 1 tablet by mouth daily.    . Omega-3 Fatty Acids (FISH OIL) 1200 MG CAPS Take 1,200 mg by mouth daily.    . pantoprazole (PROTONIX) 40 MG tablet Take 1 tablet (40 mg total) by mouth 2 (two) times daily. 60 tablet 5  . potassium chloride SA (K-DUR,KLOR-CON) 20 MEQ tablet Take 40 meq for the FIRST 2 DAYS, THEN take 20 meq daily 90 tablet 3  . pravastatin (PRAVACHOL) 80 MG tablet Take 1 tablet (80 mg total) by  mouth every evening. 30 tablet 11  . saw palmetto 160 MG capsule Take 160 mg by mouth daily.    Marland Kitchen spironolactone (ALDACTONE) 25 MG tablet TAKE ONE TABLET BY MOUTH DAILY. 90 tablet 3   No current facility-administered medications for this visit.    Discontinued Meds:   There are no discontinued medications.  Patient Active Problem List   Diagnosis Date Noted  . Anemia 04/16/2014  . 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 Chest pain, exertional   . Hyperlipidemia   . Hypertension     LABS    Component Value Date/Time   NA 140 07/14/2015 1628   NA 142 04/19/2014 0636   NA 138 04/17/2014 0631   K 3.3* 07/14/2015 1628   K 4.1 04/19/2014 0636   K 4.1 04/17/2014 0631   CL 99 07/14/2015 1628   CL 106 04/19/2014 0636   CL 102 04/17/2014 0631   CO2 29 07/14/2015 1628   CO2 25 04/19/2014 0636   CO2 24 04/17/2014 0631   GLUCOSE 89 07/14/2015 1628   GLUCOSE 96 04/19/2014 0636   GLUCOSE 104* 04/17/2014 0631  BUN 21 07/14/2015 1628   BUN 22 04/19/2014 0636   BUN 48* 04/17/2014 0631   CREATININE 1.42* 07/14/2015 1628   CREATININE 1.85* 04/19/2014 0636   CREATININE 2.61* 04/17/2014 0631   CREATININE 3.19* 04/16/2014 1852   CREATININE 1.34 09/03/2012 0940   CREATININE 1.53* 07/23/2012 0850   CALCIUM 9.5 07/14/2015 1628   CALCIUM 8.5 04/19/2014 0636   CALCIUM 9.1 04/17/2014 0631   GFRNONAA 37* 04/19/2014 0636   GFRNONAA 25* 04/17/2014 0631   GFRNONAA 19* 04/16/2014 1852   GFRAA 43* 04/19/2014 0636   GFRAA 29* 04/17/2014 0631   GFRAA 22* 04/16/2014 1852   CMP     Component Value Date/Time   NA 140 07/14/2015 1628   K 3.3* 07/14/2015 1628   CL 99 07/14/2015 1628   CO2 29 07/14/2015 1628   GLUCOSE 89 07/14/2015 1628   BUN 21 07/14/2015 1628   CREATININE 1.42* 07/14/2015 1628   CREATININE 1.85* 04/19/2014 0636   CALCIUM 9.5 07/14/2015 1628   PROT 6.3 07/14/2015 1628   ALBUMIN 3.9 07/14/2015 1628   AST 16 07/14/2015 1628   ALT 18 07/14/2015 1628    ALKPHOS 93 07/14/2015 1628   BILITOT 0.6 07/14/2015 1628   GFRNONAA 37* 04/19/2014 0636   GFRAA 43* 04/19/2014 0636       Component Value Date/Time   WBC 5.6 07/14/2015 1628   WBC 5.4 04/19/2014 0636   WBC 4.6 04/18/2014 0728   HGB 16.7 07/14/2015 1628   HGB 9.4* 04/19/2014 0636   HGB 7.8* 04/18/2014 0728   HCT 48.2 07/14/2015 1628   HCT 31.5* 04/19/2014 0636   HCT 26.2* 04/18/2014 0728   MCV 88.6 07/14/2015 1628   MCV 74.8* 04/19/2014 0636   MCV 72.0* 04/18/2014 0728    Lipid Panel     Component Value Date/Time   CHOL 138 10/17/2012 1011   TRIG 155* 10/17/2012 1011   HDL 35* 10/17/2012 1011   CHOLHDL 3.9 10/17/2012 1011   VLDL 31 10/17/2012 1011   LDLCALC 72 10/17/2012 1011    ABG No results found for: PHART, PCO2ART, PO2ART, HCO3, TCO2, ACIDBASEDEF, O2SAT   No results found for: TSH BNP (last 3 results) No results for input(s): BNP in the last 8760 hours.  ProBNP (last 3 results) No results for input(s): PROBNP in the last 8760 hours.  Cardiac Panel (last 3 results) No results for input(s): CKTOTAL, CKMB, TROPONINI, RELINDX in the last 72 hours.  Iron/TIBC/Ferritin/ %Sat    Component Value Date/Time   IRON 535* 04/17/2014 0631   TIBC 921* 04/17/2014 0631   FERRITIN 2* 04/17/2014 0631   IRONPCTSAT 58* 04/17/2014 0631     EKG Orders placed or performed in visit on 07/14/15  . EKG 12-Lead     Prior Assessment and Plan Problem List as of 07/21/2015      Cardiovascular and Mediastinum   Hypertension   Last Assessment & Plan 05/07/2014 Office Visit Written 05/07/2014  1:36 PM by Lendon Colonel, NP    BP is controlled Will given him refills on amlodipine, metoprolol. See him in 6 months.         Nervous and Auditory   Hyperlipidemia   Last Assessment & Plan 05/07/2014 Office Visit Written 05/07/2014  1:35 PM by Lendon Colonel, NP    Follow up with PCP for ongoing labs. Continue statin therapy.        Genitourinary   Chronic kidney disease, stage  II (mild)   Last Assessment & Plan 04/13/2014 Office  Visit Written 04/13/2014  4:28 PM by Lendon Colonel, NP    [place] labs will be completed. The patient is on spironolactone, he has been placed on this as a result of hypokalemia chronically, and was taken off a potassium supplement by Dr. Lattie Haw in the past. May need to remove spironolactone should his kidney function continued to be an issue and restart potassium supplements.        Other   Chest pain, exertional   Last Assessment & Plan 05/07/2014 Office Visit Written 05/07/2014  1:34 PM by Lendon Colonel, NP    Completely resolved with resolution of bleeding and increase in Hgb. No planned cardiac testing at this time.       Anemia   Last Assessment & Plan 05/07/2014 Office Visit Written 05/07/2014  1:38 PM by Lendon Colonel, NP    Follow up with Dr. Sydell Axon as directed. Labs due on Monday          Imaging: No results found.

## 2015-08-05 ENCOUNTER — Ambulatory Visit: Payer: Self-pay | Admitting: Cardiovascular Disease

## 2015-09-12 ENCOUNTER — Other Ambulatory Visit: Payer: Self-pay | Admitting: Adult Health

## 2015-10-04 ENCOUNTER — Other Ambulatory Visit: Payer: Self-pay | Admitting: Adult Health

## 2016-02-29 IMAGING — CR DG CHEST 1V PORT
1 series · 1 of 1 positions shown · non-contrast
Comparison: 04/13/2014

CLINICAL DATA: Chest pain. History of myocardial infarction. Mild
chronic renal insufficiency.

EXAM:
PORTABLE CHEST - 1 VIEW

[portable]
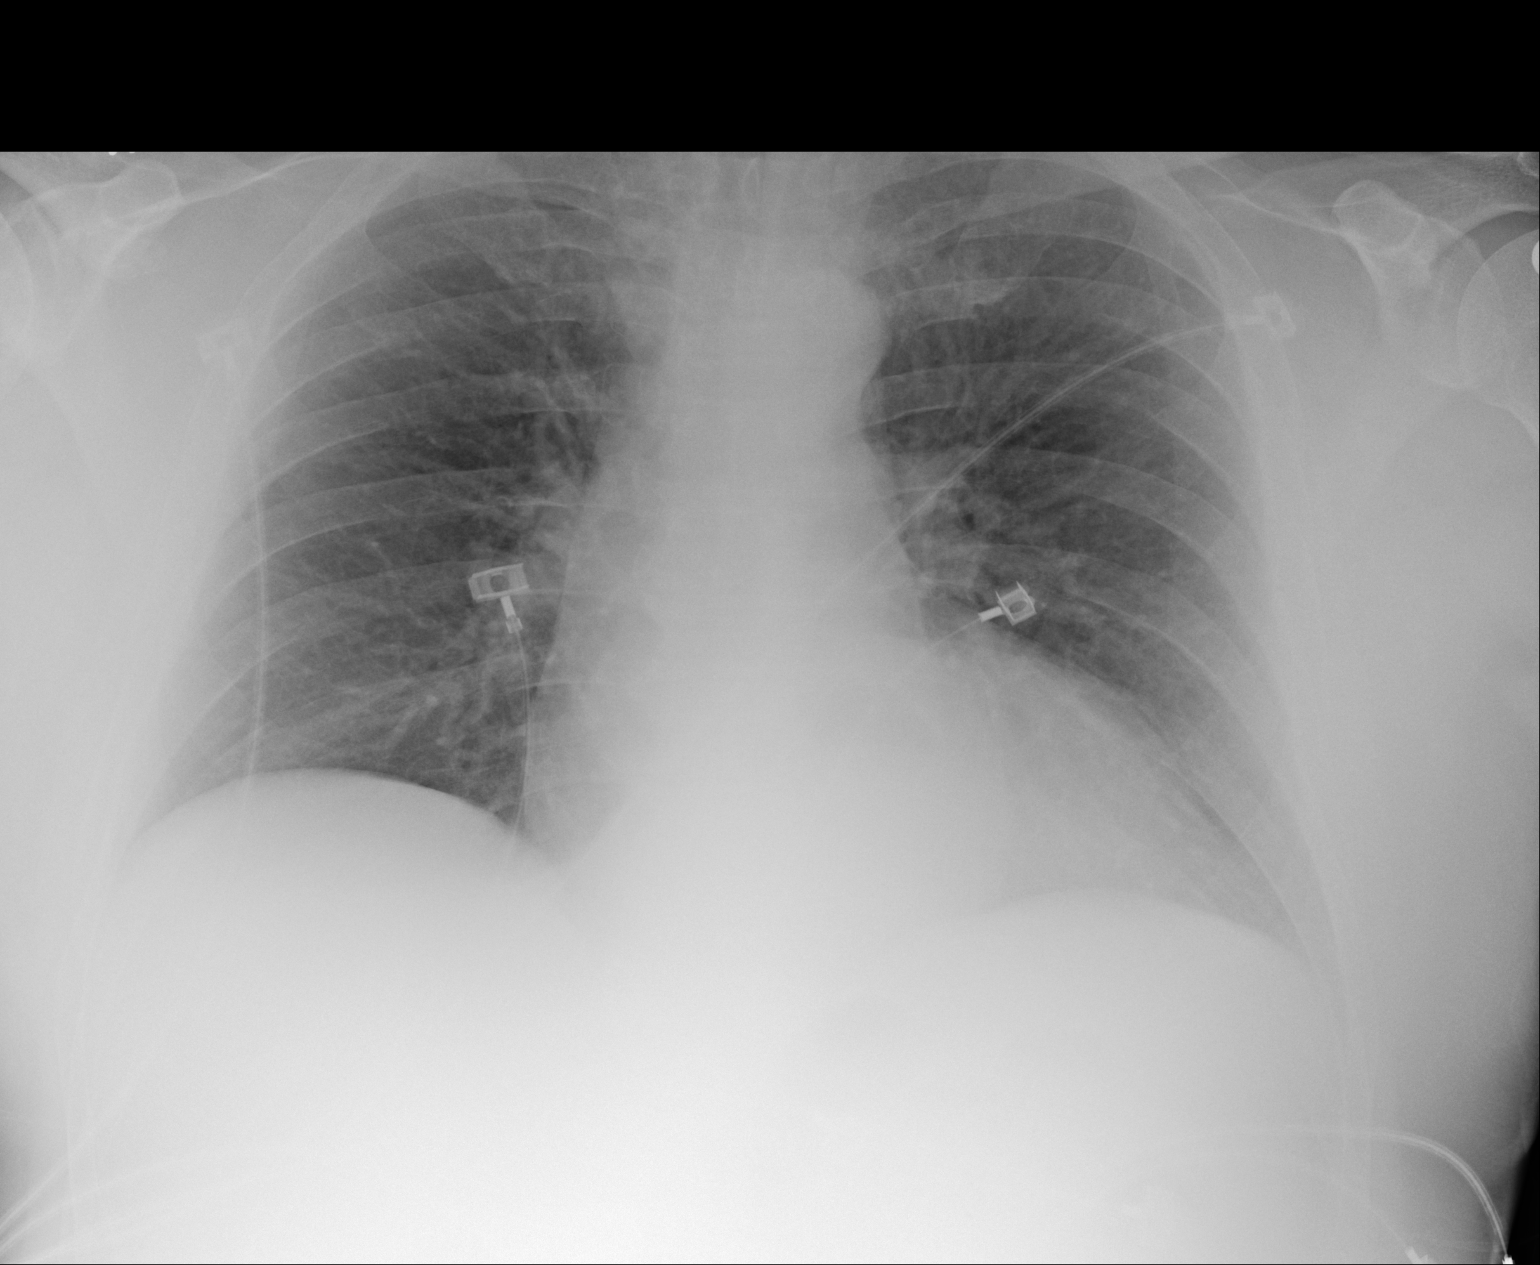

[1 of 1 positions shown; findings below may reference images not displayed]

FINDINGS: Borderline cardiomegaly. No edema. Moderate-sized hiatal hernia. No
pleural effusion.

The lungs appear clear.
IMPRESSION: 1. Moderate-sized hiatal hernia.
2. Borderline cardiomegaly.

## 2016-03-01 ENCOUNTER — Other Ambulatory Visit: Payer: Self-pay | Admitting: Adult Health

## 2016-04-05 ENCOUNTER — Other Ambulatory Visit: Payer: Self-pay | Admitting: Adult Health

## 2016-04-25 ENCOUNTER — Other Ambulatory Visit: Payer: Self-pay | Admitting: Adult Health

## 2016-05-02 ENCOUNTER — Other Ambulatory Visit: Payer: Self-pay | Admitting: Adult Health

## 2016-07-05 ENCOUNTER — Other Ambulatory Visit: Payer: Self-pay | Admitting: Adult Health

## 2016-08-20 ENCOUNTER — Other Ambulatory Visit: Payer: Self-pay | Admitting: Adult Health

## 2019-06-15 ENCOUNTER — Encounter: Payer: Self-pay | Admitting: Internal Medicine

## 2020-12-19 ENCOUNTER — Emergency Department (HOSPITAL_COMMUNITY): Payer: Medicare HMO

## 2020-12-19 ENCOUNTER — Observation Stay (HOSPITAL_COMMUNITY)
Admission: EM | Admit: 2020-12-19 | Discharge: 2020-12-20 | Disposition: A | Discharge status: Home / Self Care | Payer: Medicare HMO | Attending: Family Medicine | Admitting: Family Medicine

## 2020-12-19 ENCOUNTER — Other Ambulatory Visit: Payer: Self-pay

## 2020-12-19 DIAGNOSIS — Z7982 Long term (current) use of aspirin: Secondary | ICD-10-CM | POA: Insufficient documentation

## 2020-12-19 DIAGNOSIS — Z87891 Personal history of nicotine dependence: Secondary | ICD-10-CM | POA: Diagnosis not present

## 2020-12-19 DIAGNOSIS — K449 Diaphragmatic hernia without obstruction or gangrene: Secondary | ICD-10-CM | POA: Diagnosis not present

## 2020-12-19 DIAGNOSIS — I129 Hypertensive chronic kidney disease with stage 1 through stage 4 chronic kidney disease, or unspecified chronic kidney disease: Secondary | ICD-10-CM | POA: Insufficient documentation

## 2020-12-19 DIAGNOSIS — D735 Infarction of spleen: Principal | ICD-10-CM | POA: Diagnosis present

## 2020-12-19 DIAGNOSIS — N182 Chronic kidney disease, stage 2 (mild): Secondary | ICD-10-CM | POA: Diagnosis not present

## 2020-12-19 DIAGNOSIS — R1013 Epigastric pain: Secondary | ICD-10-CM | POA: Diagnosis present

## 2020-12-19 DIAGNOSIS — Z79899 Other long term (current) drug therapy: Secondary | ICD-10-CM | POA: Insufficient documentation

## 2020-12-19 DIAGNOSIS — R1010 Upper abdominal pain, unspecified: Secondary | ICD-10-CM

## 2020-12-19 LAB — CBC
HCT: 29.3 % — ABNORMAL LOW (ref 39.0–52.0)
Hemoglobin: 8.6 g/dL — ABNORMAL LOW (ref 13.0–17.0)
MCH: 18 pg — ABNORMAL LOW (ref 26.0–34.0)
MCHC: 29.4 g/dL — ABNORMAL LOW (ref 30.0–36.0)
MCV: 61.4 fL — ABNORMAL LOW (ref 80.0–100.0)
Platelets: 230 10*3/uL (ref 150–400)
RBC: 4.77 MIL/uL (ref 4.22–5.81)
RDW: 18.4 % — ABNORMAL HIGH (ref 11.5–15.5)
WBC: 4.5 10*3/uL (ref 4.0–10.5)
nRBC: 0 % (ref 0.0–0.2)

## 2020-12-19 LAB — DIFFERENTIAL
Abs Immature Granulocytes: 0.03 10*3/uL (ref 0.00–0.07)
Basophils Absolute: 0.1 10*3/uL (ref 0.0–0.1)
Basophils Relative: 1 %
Eosinophils Absolute: 0.3 10*3/uL (ref 0.0–0.5)
Eosinophils Relative: 3 %
Immature Granulocytes: 0 %
Lymphocytes Relative: 19 %
Lymphs Abs: 1.8 10*3/uL (ref 0.7–4.0)
Monocytes Absolute: 0.9 10*3/uL (ref 0.1–1.0)
Monocytes Relative: 10 %
Neutro Abs: 6.1 10*3/uL (ref 1.7–7.7)
Neutrophils Relative %: 67 %

## 2020-12-19 LAB — COMPREHENSIVE METABOLIC PANEL
ALT: 11 U/L (ref 0–44)
AST: 14 U/L — ABNORMAL LOW (ref 15–41)
Albumin: 3.9 g/dL (ref 3.5–5.0)
Alkaline Phosphatase: 58 U/L (ref 38–126)
Anion gap: 5 (ref 5–15)
BUN: 12 mg/dL (ref 8–23)
CO2: 25 mmol/L (ref 22–32)
Calcium: 7.9 mg/dL — ABNORMAL LOW (ref 8.9–10.3)
Chloride: 107 mmol/L (ref 98–111)
Creatinine, Ser: 0.93 mg/dL (ref 0.61–1.24)
GFR, Estimated: >60 mL/min (ref >60)
Glucose, Bld: 112 mg/dL — ABNORMAL HIGH (ref 70–99)
Potassium: 3.2 mmol/L — ABNORMAL LOW (ref 3.5–5.1)
Sodium: 137 mmol/L (ref 135–145)
Total Bilirubin: 0.6 mg/dL (ref 0.3–1.2)
Total Protein: 6.8 g/dL (ref 6.5–8.1)

## 2020-12-19 LAB — TROPONIN I (HIGH SENSITIVITY)
Troponin I (High Sensitivity): 4 ng/L (ref <18)
Troponin I (High Sensitivity): 5 ng/L (ref <18)

## 2020-12-19 LAB — LIPASE, BLOOD: Lipase: 26 U/L (ref 11–51)

## 2020-12-19 LAB — POC OCCULT BLOOD, ED: Fecal Occult Bld: POSITIVE — AB

## 2020-12-19 MED ORDER — MORPHINE SULFATE (PF) 4 MG/ML IV SOLN: 4.0000 mg | INTRAVENOUS | Status: DC | PRN

## 2020-12-19 MED ORDER — LISINOPRIL 10 MG PO TABS: 40.0000 mg | ORAL_TABLET | Freq: Every day | ORAL | Status: DC

## 2020-12-19 MED ORDER — HYDROCHLOROTHIAZIDE 25 MG PO TABS: 25.0000 mg | ORAL_TABLET | Freq: Every day | ORAL | Status: DC

## 2020-12-19 MED ORDER — SODIUM CHLORIDE 0.9 % IV BOLUS (SEPSIS)
1000.0000 mL | Freq: Once | INTRAVENOUS | Status: AC
  Administered 2020-12-19: 1000 mL via INTRAVENOUS

## 2020-12-19 MED ORDER — OXYCODONE HCL 5 MG PO TABS: 5.0000 mg | ORAL_TABLET | ORAL | Status: DC | PRN

## 2020-12-19 MED ORDER — ACETAMINOPHEN 650 MG RE SUPP: 650.0000 mg | Freq: Four times a day (QID) | RECTAL | Status: DC | PRN

## 2020-12-19 MED ORDER — IOHEXOL 300 MG/ML  SOLN
100.0000 mL | Freq: Once | INTRAMUSCULAR | Status: AC | PRN
  Administered 2020-12-19: 100 mL via INTRAVENOUS

## 2020-12-19 MED ORDER — LISINOPRIL-HYDROCHLOROTHIAZIDE 20-12.5 MG PO TABS: 2.0000 | ORAL_TABLET | Freq: Every day | ORAL | Status: DC

## 2020-12-19 MED ORDER — AMLODIPINE BESYLATE 5 MG PO TABS: 5.0000 mg | ORAL_TABLET | Freq: Every day | ORAL | Status: DC

## 2020-12-19 MED ORDER — PANTOPRAZOLE SODIUM 40 MG IV SOLR: 40.0000 mg | Freq: Two times a day (BID) | INTRAVENOUS | Status: DC

## 2020-12-19 MED ORDER — ACETAMINOPHEN 325 MG PO TABS: 650.0000 mg | ORAL_TABLET | Freq: Four times a day (QID) | ORAL | Status: DC | PRN

## 2020-12-19 MED ORDER — HYDROMORPHONE HCL 1 MG/ML IJ SOLN
0.5000 mg | Freq: Once | INTRAMUSCULAR | Status: AC
Start: 2020-12-19 — End: 2020-12-19
  Administered 2020-12-19: 0.5 mg via INTRAVENOUS
  Filled 2020-12-19: qty 1

## 2020-12-19 MED ORDER — ONDANSETRON HCL 4 MG/2ML IJ SOLN
4.0000 mg | Freq: Once | INTRAMUSCULAR | Status: AC
  Administered 2020-12-19: 4 mg via INTRAVENOUS
  Filled 2020-12-19: qty 2

## 2020-12-19 MED ORDER — METOPROLOL SUCCINATE ER 50 MG PO TB24: 100.0000 mg | ORAL_TABLET | Freq: Every day | ORAL | Status: DC

## 2020-12-19 MED ORDER — ONDANSETRON HCL 4 MG PO TABS: 4.0000 mg | ORAL_TABLET | Freq: Four times a day (QID) | ORAL | Status: DC | PRN

## 2020-12-19 MED ORDER — PANTOPRAZOLE SODIUM 40 MG IV SOLR
40.0000 mg | Freq: Once | INTRAVENOUS | Status: AC
  Administered 2020-12-19: 40 mg via INTRAVENOUS
  Filled 2020-12-19: qty 40

## 2020-12-19 MED ORDER — ONDANSETRON HCL 4 MG/2ML IJ SOLN: 4.0000 mg | Freq: Four times a day (QID) | INTRAMUSCULAR | Status: DC | PRN

## 2020-12-19 MED ORDER — SODIUM CHLORIDE 0.9 % IV SOLN: INTRAVENOUS | Status: DC

## 2020-12-19 MED ORDER — SPIRONOLACTONE 25 MG PO TABS: 25.0000 mg | ORAL_TABLET | Freq: Every day | ORAL | Status: DC

## 2020-12-19 MED ORDER — PRAVASTATIN SODIUM 40 MG PO TABS: 80.0000 mg | ORAL_TABLET | Freq: Every evening | ORAL | Status: DC

## 2020-12-19 NOTE — ED Provider Notes (Signed)
[facility] EMERGENCY DEPARTMENT Provider Note   CSN: [id number] Arrival date & time: 12/19/20  1951     History Chief Complaint  Patient presents with  . Abdominal Pain    Jeremiah Calhoun is a 70 y.o. male.  Patient presents with epigastric pain that is severe and he is perspiring profusely.  No shortness of breath.  Patient has a history of esophageal ulcers along with erosions to his antrum of his stomach.  He had bleeding years ago.  Patient has history of hypertension hyperlipidemia  The history is provided by the patient and medical records. No language interpreter was used.  Abdominal Pain Pain location:  Epigastric Pain quality: aching   Pain radiates to:  Does not radiate Pain severity:  Severe Onset quality:  Sudden Timing:  Constant Progression:  Worsening Chronicity:  New Context: not alcohol use   Relieved by:  Nothing Worsened by:  Nothing Associated symptoms: no chest pain, no cough, no diarrhea, no fatigue and no hematuria        Past Medical History:  Diagnosis Date  . Chest pain, exertional   . Chronic kidney disease    kidney stones  . Hyperlipidemia   . Hypertension     Patient Active Problem List   Diagnosis Date Noted  . Anemia 04/16/2014  . 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 Chest pain, exertional   . Hyperlipidemia   . Hypertension     Past Surgical History:  Procedure Laterality Date  . COLONOSCOPY N/A 04/19/2014   QVZ:DGLOVFIE hemorrhoids. Colonic diverticulosis. Single colonic polyp removed as described above  . ESOPHAGOGASTRODUODENOSCOPY N/A 04/19/2014   PPI:RJJOACZ reflux esophagitis - benign-appearing peptic stricture  -  status post dilation as described above. 5 cm hiatal hernia. Gastric erosions.    Status post Maloney dilation and gastric biopsy  . HERNIA REPAIR     X2       Family History  Problem Relation Age of Onset  . Heart attack Mother 6       Cause of death  . Heart attack Father 49        Cause of death  . Hypertension Sister     Social History   Tobacco Use  . Smoking status: Former Smoker    Packs/day: 0.80    Years: 15.00    Pack years: 12.00    Types: Cigarettes    Quit date: 10/01/2013    Years since quitting: 7.2  . Tobacco comment: Patient is trying electronic cigarette.  Substance Use Topics  . Alcohol use: Yes    Alcohol/week: 0.0 standard drinks    Comment: Rare  . Drug use: No    Home Medications Prior to Admission medications   Medication Sig Start Date End Date Taking? Authorizing Provider  amLODipine (NORVASC) 10 MG tablet TAKE 1/2 TABLET BY MOUTH DAILY. 08/20/16   Lendon Colonel, NP  Ascorbic Acid (VITAMIN C) 1000 MG tablet Take 1,000 mg by mouth daily.    [provider]  aspirin 81 MG tablet Take 81 mg by mouth daily.    [provider]  Coenzyme Q10 (CO Q 10) 100 MG CAPS Take 100 mg by mouth daily.    [provider]  ferrous sulfate 325 (65 FE) MG tablet Take 325 mg by mouth daily with breakfast. 2 tabs daily    [provider]  Flaxseed, Linseed, (FLAX SEED OIL) 1000 MG CAPS Take 1,000 mg by mouth daily.    [provider]  lisinopril-hydrochlorothiazide (PRINZIDE,ZESTORETIC) 20-12.5 MG tablet TAKE 2 TABLETS BY MOUTH ONCE DAILY. 07/05/16   Lendon Colonel, NP  Lycopene 10 MG CAPS Take 10 mg by mouth daily.    [provider]  metoprolol succinate (TOPROL-XL) 100 MG 24 hr tablet TAKE ONE TABLET BY MOUTH DAILY. TAKE WITH OR IMMEDIATELY FOLLOWING A MEAL. 05/02/16   Lendon Colonel, NP  Multiple Vitamin (MULTIVITAMIN) tablet Take 1 tablet by mouth daily.    [provider]  Omega-3 Fatty Acids (FISH OIL) 1200 MG CAPS Take 1,200 mg by mouth daily.    [provider]  pantoprazole (PROTONIX) 40 MG tablet Take 1 tablet (40 mg total) by mouth 2 (two) times daily. 04/20/14   Marjean Donna, MD  potassium chloride SA (K-DUR,KLOR-CON) 20 MEQ tablet Take 0.5 tablets (10 mEq  total) by mouth daily. 07/21/15   Lendon Colonel, NP  pravastatin (PRAVACHOL) 80 MG tablet Take 1 tablet (80 mg total) by mouth every evening. 07/31/13   Herminio Commons, MD  saw palmetto 160 MG capsule Take 160 mg by mouth daily.    [provider]  spironolactone (ALDACTONE) 25 MG tablet TAKE ONE TABLET BY MOUTH DAILY. 04/25/16   Herminio Commons, MD    Allergies    Codeine  Review of Systems   Review of Systems  Constitutional: Positive for diaphoresis. Negative for appetite change and fatigue.  HENT: Negative for congestion, ear discharge and sinus pressure.   Eyes: Negative for discharge.  Respiratory: Negative for cough.   Cardiovascular: Negative for chest pain.  Gastrointestinal: Positive for abdominal pain. Negative for diarrhea.  Genitourinary: Negative for frequency and hematuria.  Musculoskeletal: Negative for back pain.  Skin: Negative for rash.  Neurological: Negative for seizures and headaches.  Psychiatric/Behavioral: Negative for hallucinations.    Physical Exam Updated Vital Signs BP 124/72   Pulse 81   Temp 97.8 F (36.6 C) (Oral)   Resp 18   Ht 5\' 9"  (1.753 m)   Wt 87.1 kg   SpO2 95%   BMI 28.35 kg/m   Physical Exam Vitals and nursing note reviewed.  Constitutional:      Appearance: He is well-developed.  HENT:     Head: Normocephalic.     Nose: Nose normal.  Eyes:     General: No scleral icterus.    Conjunctiva/sclera: Conjunctivae normal.  Neck:     Thyroid: No thyromegaly.  Cardiovascular:     Rate and Rhythm: Normal rate and regular rhythm.     Heart sounds: No murmur heard. No friction rub. No gallop.   Pulmonary:     Breath sounds: No stridor. No wheezing or rales.  Chest:     Chest wall: No tenderness.  Abdominal:     General: There is no distension.     Tenderness: There is abdominal tenderness. There is no rebound.  Genitourinary:    Comments: Rectal exam heme positive Musculoskeletal:        General:  Normal range of motion.     Cervical back: Neck supple.  Lymphadenopathy:     Cervical: No cervical adenopathy.  Skin:    Findings: No erythema or rash.  Neurological:     Mental Status: He is alert and oriented to person, place, and time.     Motor: No abnormal muscle tone.     Coordination: Coordination normal.  Psychiatric:        Behavior: Behavior normal.     ED Results / Procedures /  Treatments   Labs (all labs ordered are listed, but only abnormal results are displayed) Labs Reviewed  COMPREHENSIVE METABOLIC PANEL - Abnormal; Notable for the following components:      Result Value   Potassium 3.2 (*)    Glucose, Bld 112 (*)    Calcium 7.9 (*)    AST 14 (*)    All other components within normal limits  CBC - Abnormal; Notable for the following components:   Hemoglobin 8.6 (*)    HCT 29.3 (*)    MCV 61.4 (*)    MCH 18.0 (*)    MCHC 29.4 (*)    RDW 18.4 (*)    All other components within normal limits  POC OCCULT BLOOD, ED - Abnormal; Notable for the following components:   Fecal Occult Bld POSITIVE (*)    All other components within normal limits  LIPASE, BLOOD  DIFFERENTIAL  URINALYSIS, ROUTINE W REFLEX MICROSCOPIC  I-STAT CREATININE, ED  TROPONIN I (HIGH SENSITIVITY)  TROPONIN I (HIGH SENSITIVITY)    EKG EKG Interpretation  Date/Time:  Monday December 19 2020 20:07:20 EDT Ventricular Rate:  79 PR Interval:  200 QRS Duration: 94 QT Interval:  354 QTC Calculation: 405 R Axis:   -21 Text Interpretation: Normal sinus rhythm Inferior infarct , age undetermined Possible Anterolateral infarct , age undetermined Abnormal ECG Confirmed by Milton Ferguson [phone]) on 12/19/2020 10:33:13 PM   Radiology CT ABDOMEN PELVIS W CONTRAST  Result Date: 12/19/2020 CLINICAL DATA:  Acute RIGHT upper quadrant pain, nonlocalized EXAM: CT ABDOMEN AND PELVIS WITH CONTRAST TECHNIQUE: Multidetector CT imaging of the abdomen and pelvis was performed using the standard protocol  following bolus administration of intravenous contrast. CONTRAST:  122mL OMNIPAQUE IOHEXOL 300 MG/ML  SOLN COMPARISON:  Prior chest x-rays. FINDINGS: Lower chest: Partially imaged large hiatal hernia, paraesophageal and sliding type extending into the chest. Contour of the retrocardiac region on prior imaging suggests this is a chronic finding. No perigastric stranding. Stomach incompletely imaged. No effusion. No consolidation. Hepatobiliary: No focal, suspicious hepatic lesion. No pericholecystic stranding. Portal vein is patent. Hepatic veins are patent. Pancreas: Pancreas without signs of ductal dilation, inflammation or contour abnormality. Spleen: Splenic infarct along the anterior spleen. Other areas of low attenuation potentially small infarcts as well. The largest area persists on delayed phase imaging. No perisplenic stranding. Adrenals/Urinary Tract: Mild thickening of the bilateral adrenal glands. No hydronephrosis. Small cysts in the bilateral kidneys. Renal sinus cysts on the LEFT. Nephrolithiasis in the RIGHT kidney, 7-8 mm calculus in the upper pole and a 12 mm calculus in the lower pole. Urinary bladder with smooth contours. No hydronephrosis. Stomach/Bowel: Sigmoid diverticular disease.  Normal appendix. Stomach herniated entirely into the chest, inverted with signs of large mixed type hernia. No acute small bowel process. Vascular/Lymphatic: Calcified atheromatous plaque in the nonaneurysmal abdominal aorta patent portal vein. There is no gastrohepatic or hepatoduodenal ligament lymphadenopathy. No retroperitoneal or mesenteric lymphadenopathy. No pelvic sidewall lymphadenopathy. Reproductive: Prostate unremarkable by CT. Other: No ascites. Mild skin thickening over the RIGHT lower abdomen with subtle stranding in the subcutaneous fat. Musculoskeletal: No acute musculoskeletal process. Spinal degenerative changes. Cystic area in the posterior back presumably large sebaceous cyst without  surrounding stranding IMPRESSION: 1. Partially imaged large hiatal hernia, paraesophageal and sliding type extending into the chest. Contour of the retrocardiac region on prior imaging suggests this is a chronic finding. No perigastric stranding.w 2. Mild skin thickening over the RIGHT lower abdomen and subtle stranding in the subcutaneous fat. Correlate with  any signs of cellulitis. 3. Splenic infarct along the anterior spleen. Other areas of low attenuation potentially small infarcts as well. No perisplenic stranding. Correlate with site of pain. 4. Nonobstructive RIGHT nephrolithiasis. 5. Sigmoid diverticular disease and pancolonic diverticulosis. No signs of acute inflammation of the colon. Normal appendix. 6. Aortic atherosclerosis. Aortic Atherosclerosis (ICD10-I70.0). Electronically Signed   By: Zetta Bills M.D.   On: 12/19/2020 22:19   DG Chest Port 1 View  Result Date: 12/19/2020 CLINICAL DATA:  70 year old male with shortness of breath. EXAM: PORTABLE CHEST 1 VIEW COMPARISON:  Chest radiograph dated 04/16/2014 FINDINGS: No focal consolidation, pleural effusion, pneumothorax. The cardiac silhouette is within limits. There is a moderate size hiatal hernia. Atherosclerotic calcification of the aorta. No acute osseous pathology. IMPRESSION: 1. No acute cardiopulmonary process. 2. Hiatal hernia. Electronically Signed   By: Anner Crete M.D.   On: 12/19/2020 21:06    Procedures Procedures   Medications Ordered in ED Medications  pantoprazole (PROTONIX) injection 40 mg (40 mg Intravenous Given 12/19/20 2052)  sodium chloride 0.9 % bolus 1,000 mL (1,000 mLs Intravenous New Bag/Given 12/19/20 2049)  HYDROmorphone (DILAUDID) injection 0.5 mg (0.5 mg Intravenous Given 12/19/20 2057)  ondansetron (ZOFRAN) injection 4 mg (4 mg Intravenous Given 12/19/20 2050)  iohexol (OMNIPAQUE) 300 MG/ML solution 100 mL (100 mLs Intravenous Contrast Given 12/19/20 2137)    ED Course  I have reviewed the triage  vital signs and the nursing notes.  Pertinent labs & imaging results that were available during my care of the patient were reviewed by me and considered in my medical decision making (see chart for details). CRITICAL CARE Performed by: Milton Ferguson Total critical care time: 40 minutes Critical care time was exclusive of separately billable procedures and treating other patients. Critical care was necessary to treat or prevent imminent or life-threatening deterioration. Critical care was time spent personally by me on the following activities: development of treatment plan with patient and/or surrogate as well as nursing, discussions with consultants, evaluation of patient's response to treatment, examination of patient, obtaining history from patient or surrogate, ordering and performing treatments and interventions, ordering and review of laboratory studies, ordering and review of radiographic studies, pulse oximetry and re-evaluation of patient's condition.    MDM Rules/Calculators/A&P                         Patient with abdominal pain splenic infarct anemia.  Also patient has a large hiatal hernia.  He will be admitted to medicine for further work-up of the splenic infarcts and hiatal hernia with significant epigastric discomfort Final Clinical Impression(s) / ED Diagnoses Final diagnoses:  Pain of upper abdomen  Splenic infarct    Rx / DC Orders ED Discharge Orders    None       Milton Ferguson, MD 12/19/20 2303

## 2020-12-19 NOTE — ED Triage Notes (Signed)
Pt here from home with cc of abdominal pain that started today after he ate. Left side under ribs. No emesis. Diaphoretic . Feels like knives

## 2020-12-19 NOTE — ED Notes (Signed)
Pt A&Ox3, RR even and nonlabored. Reports RUQ and LUQ sharp abd pain after eating tonight. Pain at this time has greatly improved. Pt states he was also diaphoretic, nausea. No pmh of similar event. No longer diaphoretic or nauseous. Skin warm and dry. Took antacid at home when pain started, no relief. Denies abd trauma prior to pain starting.

## 2020-12-20 ENCOUNTER — Encounter: Payer: Self-pay | Admitting: Internal Medicine

## 2020-12-20 DIAGNOSIS — R1013 Epigastric pain: Secondary | ICD-10-CM

## 2020-12-20 DIAGNOSIS — D735 Infarction of spleen: Secondary | ICD-10-CM | POA: Diagnosis present

## 2020-12-20 NOTE — ED Notes (Signed)
Pt d/c per MD. Ambulatory to lobby with wife.

## 2020-12-20 NOTE — Consult Note (Signed)
[place] Consult    Patient Demographics:    Jeremiah Calhoun, is a 70 y.o. male  MRN: [id number]  DOB - [birth year]  Admit Date - 12/19/2020  Referring MD/NP/PA: [facility] Primary MD for the patient is Marjean Donna, MD (Inactive)  Patient coming from: Home  Chief complaint-severe epigastric pain  HPI:    Jeremiah Calhoun  is a 70 y.o. male, with history of hypertension, hyperlipidemia, chronic kidney disease, chest pain, presents to the ED with a chief complaint of severe epigastric pain.  Patient reports that the pain started after he ate a can of sausages.  He reports that the pain was epigastric, stabbing, without radiation.  Pain lasted for half hour and was constant.  Patient reports that he was diaphoretic with a impending sense of doom.  Patient reports that he has never had symptoms like this before.  He denied associated chest pain, palpitations, shortness of breath, lightheadedness.    In the ED Temperature 97.6, heart rate 75, respiratory rate 18-20, blood pressure 124/72 White blood cell count 4.5, hemoglobin 8.6 Chemistry panel reveals a hypokalemia at 3.2 Lipase 26 Troponin IV and then 5 FOBT showed light-colored stool without melena or blood that was positive -this is in the setting of iron supplement, normal BUN  CT abdomen showed large hiatal hernia, skin thickening in the right lower quadrant possibly cellulitis, splenic infarct, nonobstructive right nephrolithiasis, sigmoid diverticular disease  Patient was treated with Dilaudid, Protonix, 1 L normal saline, Zofran in the ED, and reports that he feels much better and is ready to go home.  The patient, myself, and girlfriend at bedside, discussed all of the findings in the ED.  We discussed the possible GI bleed given the FOBT positive.  Patient was reminded that he has had this history before in 2015 where he had bleeding ulcers and his hemoglobin  was quite low.  We discussed signs and symptoms of anemia.  Patient reports that he has not had any signs or symptoms of anemia.  Patient is been advised in an untreated GI bleed could result in severe illness or death.  Patient reports understanding, and would like to follow-up with gastroenterology outpatient.  Patient is agreeable to getting his CBC followed up with primary care physician this week.  We also discussed patient's splenic infarct.  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  We also discussed the need to find the underlying cause of the infarct, with the most common causes are, and the fact that he will need to follow-up with the heme-onc doctor.  Patient reports understanding.  After discussing all the risks of being discharged home, and the benefits of being observed in the hospital tonight, patient would still like to be discharged home.  Patient is alert and oriented, and there is no reason for him not to be able to make his own decisions.  All of patient and his girlfriend's questions were answered.  Patient is discharged home with advised to follow-up with PCP, gastroenterology, and  hematology oncology.  Patient has been advised to follow-up with blood work.  Patient has been advised on ER precautions, including but not limited to: Returning to the ER if patient symptoms should recur, if patient should have chest pain, palpitations, shortness of breath, lightheadedness, generalized weakness, fatigue, melena, hematochezia.  Patient reports understanding.  Girlfriend at bedside reports that if he has any of the symptoms she will make him come in.    Review of systems:    In addition to the HPI above,  No Fever-chills, admits to diaphoresis No Headache, No changes with Vision or hearing, No problems swallowing food or Liquids, No Chest pain, Cough or Shortness of Breath, Admits to abdominal pain, nausea but no vomiting No Blood in  stool or Urine, No dysuria, No new skin rashes or bruises, No new joints pains-aches,  No new weakness, tingling, numbness in any extremity, No recent weight gain or loss, No polyuria, polydypsia or polyphagia, No significant Mental Stressors.  All other systems reviewed and are negative.    Past History of the following :    Past Medical History:  Diagnosis Date  . Chest pain, exertional   . Chronic kidney disease    kidney stones  . Hyperlipidemia   . Hypertension       Past Surgical History:  Procedure Laterality Date  . COLONOSCOPY N/A 04/19/2014   RSW:NIOEVOJJ hemorrhoids. Colonic diverticulosis. Single colonic polyp removed as described above  . ESOPHAGOGASTRODUODENOSCOPY N/A 04/19/2014   KKX:FGHWEXH reflux esophagitis - benign-appearing peptic stricture  -  status post dilation as described above. 5 cm hiatal hernia. Gastric erosions.    Status post Maloney dilation and gastric biopsy  . HERNIA REPAIR     X2      Social History:      Social History   Tobacco Use  . Smoking status: Former Smoker    Packs/day: 0.80    Years: 15.00    Pack years: 12.00    Types: Cigarettes    Quit date: 10/01/2013    Years since quitting: 7.2  . Smokeless tobacco: Not on file  . Tobacco comment: Patient is trying electronic cigarette.  Substance Use Topics  . Alcohol use: Yes    Alcohol/week: 0.0 standard drinks    Comment: Rare       Family History :     Family History  Problem Relation Age of Onset  . Heart attack Mother 38       Cause of death  . Heart attack Father 15       Cause of death  . Hypertension Sister       Home Medications:   Prior to Admission medications   Medication Sig Start Date End Date Taking? Authorizing Provider  amLODipine (NORVASC) 10 MG tablet TAKE 1/2 TABLET BY MOUTH DAILY. 08/20/16   Lendon Colonel, NP  Ascorbic Acid (VITAMIN C) 1000 MG tablet Take 1,000 mg by mouth daily.    [provider]  aspirin 81 MG tablet  Take 81 mg by mouth daily.    [provider]  Coenzyme Q10 (CO Q 10) 100 MG CAPS Take 100 mg by mouth daily.    [provider]  ferrous sulfate 325 (65 FE) MG tablet Take 325 mg by mouth daily with breakfast. 2 tabs daily    [provider]  Flaxseed, Linseed, (FLAX SEED OIL) 1000 MG CAPS Take 1,000 mg by mouth daily.    [provider]  lisinopril-hydrochlorothiazide (PRINZIDE,ZESTORETIC) 20-12.5 MG tablet  TAKE 2 TABLETS BY MOUTH ONCE DAILY. 07/05/16   Lendon Colonel, NP  Lycopene 10 MG CAPS Take 10 mg by mouth daily.    [provider]  metoprolol succinate (TOPROL-XL) 100 MG 24 hr tablet TAKE ONE TABLET BY MOUTH DAILY. TAKE WITH OR IMMEDIATELY FOLLOWING A MEAL. 05/02/16   Lendon Colonel, NP  Multiple Vitamin (MULTIVITAMIN) tablet Take 1 tablet by mouth daily.    [provider]  Omega-3 Fatty Acids (FISH OIL) 1200 MG CAPS Take 1,200 mg by mouth daily.    [provider]  pantoprazole (PROTONIX) 40 MG tablet Take 1 tablet (40 mg total) by mouth 2 (two) times daily. 04/20/14   Marjean Donna, MD  pravastatin (PRAVACHOL) 80 MG tablet Take 1 tablet (80 mg total) by mouth every evening. 07/31/13   Herminio Commons, MD  saw palmetto 160 MG capsule Take 160 mg by mouth daily.    [provider]  spironolactone (ALDACTONE) 25 MG tablet TAKE ONE TABLET BY MOUTH DAILY. 04/25/16   Herminio Commons, MD     Allergies:     Allergies  Allergen Reactions  . Codeine Nausea And Vomiting     Physical Exam:   Vitals  Blood pressure 124/72, pulse 81, temperature 97.8 F (36.6 C), temperature source Oral, resp. rate 18, height 5\' 9"  (1.753 m), weight 87.1 kg, SpO2 95 %.  1.  General: Patient walking back to bed from bathroom, normal gait, no difficulty  2. Psychiatric: Alert and oriented x3, pleasant, cooperative with exam, mood and behavior normal for situation  3. Neurologic: Cranial nerves II through XII are  grossly intact, moves all 4 extremities voluntarily, normal gait, alert and oriented x3, speech and language are normal, no acute deficits on limited exam  4. HEENMT:  Head is atraumatic, normocephalic, pupils reactive to light, neck is supple, trachea is midline, mucous membranes are moist  5. Respiratory : Lungs are clear to auscultation bilaterally without wheeze, rhonchi, rales  6. Cardiovascular : Heart rate is normal, rhythm is regular, no murmurs rubs or gallops  7. Gastrointestinal:  Abdomen is soft, nondistended, nontender to palpation  8. Skin:  Skin is warm dry and intact without acute lesion on limited exam, denies any rashes  9.Musculoskeletal:  No acute deformity, no calf tenderness, peripheral pulses palpated    Data Review:    CBC Recent Labs  Lab 12/19/20 2024 12/19/20 2031  WBC 4.5  --   HGB 8.6*  --   HCT 29.3*  --   PLT 230  --   MCV 61.4*  --   MCH 18.0*  --   MCHC 29.4*  --   RDW 18.4*  --   LYMPHSABS  --  1.8  MONOABS  --  0.9  EOSABS  --  0.3  BASOSABS  --  0.1   ------------------------------------------------------------------------------------------------------------------  Results for orders placed or performed during the hospital encounter of 12/19/20 (from the past 48 hour(s))  Lipase, blood     Status: None   Collection Time: 12/19/20  8:24 PM  Result Value Ref Range   Lipase 26 11 - 51 U/L    Comment: Performed at [facility], [address] [place], [place] [address]  Comprehensive metabolic panel     Status: Abnormal   Collection Time: 12/19/20  8:24 PM  Result Value Ref Range   Sodium 137 135 - 145 mmol/L   Potassium 3.2 (L) 3.5 - 5.1 mmol/L   Chloride 107 98 - 111  mmol/L   CO2 25 22 - 32 mmol/L   Glucose, Bld 112 (H) 70 - 99 mg/dL    Comment: Glucose reference range applies only to samples taken after fasting for at least 8 hours.   BUN 12 8 - 23 mg/dL   Creatinine, Ser 0.93 0.61 - 1.24 mg/dL   Calcium 7.9 (L) 8.9 - 10.3  mg/dL   Total Protein 6.8 6.5 - 8.1 g/dL   Albumin 3.9 3.5 - 5.0 g/dL   AST 14 (L) 15 - 41 U/L   ALT 11 0 - 44 U/L   Alkaline Phosphatase 58 38 - 126 U/L   Total Bilirubin 0.6 0.3 - 1.2 mg/dL   GFR, Estimated >60 >60 mL/min    Comment: (NOTE) Calculated using the CKD-EPI Creatinine Equation (2021)    Anion gap 5 5 - 15    Comment: Performed at [facility], [address], [place], [place] [address]  CBC     Status: Abnormal   Collection Time: 12/19/20  8:24 PM  Result Value Ref Range   WBC 4.5 4.0 - 10.5 K/uL   RBC 4.77 4.22 - 5.81 MIL/uL   Hemoglobin 8.6 (L) 13.0 - 17.0 g/dL    Comment: Reticulocyte Hemoglobin testing may be clinically indicated, consider ordering this additional test [id number]    HCT 29.3 (L) 39.0 - 52.0 %   MCV 61.4 (L) 80.0 - 100.0 fL   MCH 18.0 (L) 26.0 - 34.0 pg   MCHC 29.4 (L) 30.0 - 36.0 g/dL   RDW 18.4 (H) 11.5 - 15.5 %   Platelets 230 150 - 400 K/uL   nRBC 0.0 0.0 - 0.2 %    Comment: Performed at [facility], [address], [place], [place] [address]  Differential     Status: None   Collection Time: 12/19/20  8:31 PM  Result Value Ref Range   Neutrophils Relative % 67 %   Neutro Abs 6.1 1.7 - 7.7 K/uL   Lymphocytes Relative 19 %   Lymphs Abs 1.8 0.7 - 4.0 K/uL   Monocytes Relative 10 %   Monocytes Absolute 0.9 0.1 - 1.0 K/uL   Eosinophils Relative 3 %   Eosinophils Absolute 0.3 0.0 - 0.5 K/uL   Basophils Relative 1 %   Basophils Absolute 0.1 0.0 - 0.1 K/uL   Immature Granulocytes 0 %   Abs Immature Granulocytes 0.03 0.00 - 0.07 K/uL    Comment: Performed at [facility], [address] [place], [place] [address]  Troponin I (High Sensitivity)     Status: None   Collection Time: 12/19/20  8:31 PM  Result Value Ref Range   Troponin I (High Sensitivity) 4 <18 ng/L    Comment: (NOTE) Elevated high sensitivity troponin I (hsTnI) values and significant  changes across serial measurements may suggest ACS but many other  chronic and acute  conditions are known to elevate hsTnI results.  Refer to the "Links" section for chest pain algorithms and additional  guidance. Performed at [facility], [address] [place], [place] [address]   POC occult blood, ED     Status: Abnormal   Collection Time: 12/19/20 10:19 PM  Result Value Ref Range   Fecal Occult Bld POSITIVE (A) NEGATIVE  Troponin I (High Sensitivity)     Status: None   Collection Time: 12/19/20 10:36 PM  Result Value Ref Range   Troponin I (High Sensitivity) 5 <18 ng/L    Comment: (NOTE) Elevated high sensitivity troponin I (hsTnI) values and  significant  changes across serial measurements may suggest ACS but many other  chronic and acute conditions are known to elevate hsTnI results.  Refer to the "Links" section for chest pain algorithms and additional  guidance. Performed at [facility], [address], [place], [place] [address]     Chemistries  Recent Labs  Lab 12/19/20 2024  NA 137  K 3.2*  CL 107  CO2 25  GLUCOSE 112*  BUN 12  CREATININE 0.93  CALCIUM 7.9*  AST 14*  ALT 11  ALKPHOS 58  BILITOT 0.6   ------------------------------------------------------------------------------------------------------------------  ------------------------------------------------------------------------------------------------------------------ GFR: Estimated Creatinine Clearance: 83.1 mL/min (by C-G formula based on SCr of 0.93 mg/dL). Liver Function Tests: Recent Labs  Lab 12/19/20 2024  AST 14*  ALT 11  ALKPHOS 58  BILITOT 0.6  PROT 6.8  ALBUMIN 3.9   Recent Labs  Lab 12/19/20 2024  LIPASE 26   No results for input(s): AMMONIA in the last 168 hours. Coagulation Profile: No results for input(s): INR, PROTIME in the last 168 hours. Cardiac Enzymes: No results for input(s): CKTOTAL, CKMB, CKMBINDEX, TROPONINI in the last 168 hours. BNP (last 3 results) No results for input(s): PROBNP in the last 8760 hours. HbA1C: No results for input(s):  HGBA1C in the last 72 hours. CBG: No results for input(s): GLUCAP in the last 168 hours. Lipid Profile: No results for input(s): CHOL, HDL, LDLCALC, TRIG, CHOLHDL, LDLDIRECT in the last 72 hours. Thyroid Function Tests: No results for input(s): TSH, T4TOTAL, FREET4, T3FREE, THYROIDAB in the last 72 hours. Anemia Panel: No results for input(s): VITAMINB12, FOLATE, FERRITIN, TIBC, IRON, RETICCTPCT in the last 72 hours.  --------------------------------------------------------------------------------------------------------------- Urine analysis: No results found for: COLORURINE, APPEARANCEUR, LABSPEC, PHURINE, GLUCOSEU, HGBUR, BILIRUBINUR, KETONESUR, PROTEINUR, UROBILINOGEN, NITRITE, LEUKOCYTESUR    Imaging Results:    CT ABDOMEN PELVIS W CONTRAST  Result Date: 12/19/2020 CLINICAL DATA:  Acute RIGHT upper quadrant pain, nonlocalized EXAM: CT ABDOMEN AND PELVIS WITH CONTRAST TECHNIQUE: Multidetector CT imaging of the abdomen and pelvis was performed using the standard protocol following bolus administration of intravenous contrast. CONTRAST:  145mL OMNIPAQUE IOHEXOL 300 MG/ML  SOLN COMPARISON:  Prior chest x-rays. FINDINGS: Lower chest: Partially imaged large hiatal hernia, paraesophageal and sliding type extending into the chest. Contour of the retrocardiac region on prior imaging suggests this is a chronic finding. No perigastric stranding. Stomach incompletely imaged. No effusion. No consolidation. Hepatobiliary: No focal, suspicious hepatic lesion. No pericholecystic stranding. Portal vein is patent. Hepatic veins are patent. Pancreas: Pancreas without signs of ductal dilation, inflammation or contour abnormality. Spleen: Splenic infarct along the anterior spleen. Other areas of low attenuation potentially small infarcts as well. The largest area persists on delayed phase imaging. No perisplenic stranding. Adrenals/Urinary Tract: Mild thickening of the bilateral adrenal glands. No hydronephrosis.  Small cysts in the bilateral kidneys. Renal sinus cysts on the LEFT. Nephrolithiasis in the RIGHT kidney, 7-8 mm calculus in the upper pole and a 12 mm calculus in the lower pole. Urinary bladder with smooth contours. No hydronephrosis. Stomach/Bowel: Sigmoid diverticular disease.  Normal appendix. Stomach herniated entirely into the chest, inverted with signs of large mixed type hernia. No acute small bowel process. Vascular/Lymphatic: Calcified atheromatous plaque in the nonaneurysmal abdominal aorta patent portal vein. There is no gastrohepatic or hepatoduodenal ligament lymphadenopathy. No retroperitoneal or mesenteric lymphadenopathy. No pelvic sidewall lymphadenopathy. Reproductive: Prostate unremarkable by CT. Other: No ascites. Mild skin thickening over the RIGHT lower abdomen with subtle stranding in the subcutaneous fat. Musculoskeletal: No acute musculoskeletal  process. Spinal degenerative changes. Cystic area in the posterior back presumably large sebaceous cyst without surrounding stranding IMPRESSION: 1. Partially imaged large hiatal hernia, paraesophageal and sliding type extending into the chest. Contour of the retrocardiac region on prior imaging suggests this is a chronic finding. No perigastric stranding.w 2. Mild skin thickening over the RIGHT lower abdomen and subtle stranding in the subcutaneous fat. Correlate with any signs of cellulitis. 3. Splenic infarct along the anterior spleen. Other areas of low attenuation potentially small infarcts as well. No perisplenic stranding. Correlate with site of pain. 4. Nonobstructive RIGHT nephrolithiasis. 5. Sigmoid diverticular disease and pancolonic diverticulosis. No signs of acute inflammation of the colon. Normal appendix. 6. Aortic atherosclerosis. Aortic Atherosclerosis (ICD10-I70.0). Electronically Signed   By: Zetta Bills M.D.   On: 12/19/2020 22:19   DG Chest Port 1 View  Result Date: 12/19/2020 CLINICAL DATA:  70 year old male with  shortness of breath. EXAM: PORTABLE CHEST 1 VIEW COMPARISON:  Chest radiograph dated 04/16/2014 FINDINGS: No focal consolidation, pleural effusion, pneumothorax. The cardiac silhouette is within limits. There is a moderate size hiatal hernia. Atherosclerotic calcification of the aorta. No acute osseous pathology. IMPRESSION: 1. No acute cardiopulmonary process. 2. Hiatal hernia. Electronically Signed   By: Anner Crete M.D.   On: 12/19/2020 21:06       Assessment & Plan:    Principal Problem:   Acute epigastric pain Active Problems:   Splenic infarct   1. Acute epigastric pain 1. Advised patient to continue taking his Protonix twice daily 2. Patient reports that he is due for his endoscopy and colonoscopy-advised patient to follow-up with gastroenterology 2. Hiatal hernia 1. Advised patient to follow-up with gastroenterology 3. FOBT positive 1. Likely a false positive in the setting of normal BUN, patient is on iron supplement, stool did not appear melanotic or bloody 2. Patient hemoglobin is stable 8.6, advised follow-up CBC with short interval 4. Splenic infarct 1. Advised patient that we will need to find the cause of the splenic infarct, follow-up with hematology oncology 5.    Family Communication: Patients condition and plan of care including tests that were ordered, and follow-up that would be required after discharge.  Patient and girlfriend girlfriend  indicate understanding and agree with the plan.     Time spent in minutes : [place]

## 2020-12-20 NOTE — Discharge Instructions (Signed)
Follow up with PCP within 3 days Follow up with GI and Heme/Onc as soon as an appointment is available.   Follow up CBC within 3 days.   Call 911 or go to your nearest emergency room if you experience any of the following: If you have a repeat of the symptoms that brought you in tonight. If you have chest pain, weakness, feeling that something is not right, shortness of breath, palpitations, uncontrollable pain or nausea.

## 2020-12-29 ENCOUNTER — Encounter (HOSPITAL_COMMUNITY): Payer: Self-pay | Admitting: *Deleted

## 2020-12-30 ENCOUNTER — Other Ambulatory Visit (HOSPITAL_COMMUNITY): Payer: Self-pay | Admitting: *Deleted

## 2020-12-30 ENCOUNTER — Other Ambulatory Visit: Payer: Self-pay

## 2020-12-30 ENCOUNTER — Inpatient Hospital Stay (HOSPITAL_COMMUNITY): Payer: Medicare HMO | Attending: Hematology and Oncology | Admitting: Hematology and Oncology

## 2020-12-30 ENCOUNTER — Inpatient Hospital Stay (HOSPITAL_COMMUNITY): Payer: Medicare HMO

## 2020-12-30 ENCOUNTER — Telehealth (HOSPITAL_COMMUNITY): Payer: Self-pay

## 2020-12-30 ENCOUNTER — Encounter (HOSPITAL_COMMUNITY): Payer: Self-pay | Admitting: Hematology and Oncology

## 2020-12-30 VITALS — BP 123/70 | HR 66 | Temp 97.7°F | Resp 17 | Ht 69.0 in | Wt 206.2 lb

## 2020-12-30 DIAGNOSIS — F17210 Nicotine dependence, cigarettes, uncomplicated: Secondary | ICD-10-CM | POA: Insufficient documentation

## 2020-12-30 DIAGNOSIS — I129 Hypertensive chronic kidney disease with stage 1 through stage 4 chronic kidney disease, or unspecified chronic kidney disease: Secondary | ICD-10-CM | POA: Insufficient documentation

## 2020-12-30 DIAGNOSIS — D509 Iron deficiency anemia, unspecified: Secondary | ICD-10-CM | POA: Diagnosis not present

## 2020-12-30 DIAGNOSIS — Z7982 Long term (current) use of aspirin: Secondary | ICD-10-CM | POA: Insufficient documentation

## 2020-12-30 DIAGNOSIS — Z79899 Other long term (current) drug therapy: Secondary | ICD-10-CM | POA: Diagnosis not present

## 2020-12-30 DIAGNOSIS — D735 Infarction of spleen: Secondary | ICD-10-CM

## 2020-12-30 DIAGNOSIS — N189 Chronic kidney disease, unspecified: Secondary | ICD-10-CM | POA: Insufficient documentation

## 2020-12-30 DIAGNOSIS — E785 Hyperlipidemia, unspecified: Secondary | ICD-10-CM | POA: Insufficient documentation

## 2020-12-30 DIAGNOSIS — K449 Diaphragmatic hernia without obstruction or gangrene: Secondary | ICD-10-CM | POA: Diagnosis not present

## 2020-12-30 LAB — IRON AND TIBC
Iron: 153 ug/dL (ref 45–182)
Saturation Ratios: 37 % (ref 17.9–39.5)
TIBC: 418 ug/dL (ref 250–450)
UIBC: 265 ug/dL

## 2020-12-30 LAB — CBC WITH DIFFERENTIAL/PLATELET
Abs Immature Granulocytes: 0.02 10*3/uL (ref 0.00–0.07)
Basophils Absolute: 0.1 10*3/uL (ref 0.0–0.1)
Basophils Relative: 1 %
Eosinophils Absolute: 0.3 10*3/uL (ref 0.0–0.5)
Eosinophils Relative: 6 %
HCT: 46.5 % (ref 39.0–52.0)
Hemoglobin: 14.2 g/dL (ref 13.0–17.0)
Immature Granulocytes: 0 %
Lymphocytes Relative: 19 %
Lymphs Abs: 1.1 10*3/uL (ref 0.7–4.0)
MCH: 30.8 pg (ref 26.0–34.0)
MCHC: 30.5 g/dL (ref 30.0–36.0)
MCV: 100.9 fL — ABNORMAL HIGH (ref 80.0–100.0)
Monocytes Absolute: 0.6 10*3/uL (ref 0.1–1.0)
Monocytes Relative: 11 %
Neutro Abs: 3.6 10*3/uL (ref 1.7–7.7)
Neutrophils Relative %: 63 %
Platelets: 204 10*3/uL (ref 150–400)
RBC: 4.61 MIL/uL (ref 4.22–5.81)
RDW: 13.6 % (ref 11.5–15.5)
WBC: 5.7 10*3/uL (ref 4.0–10.5)
nRBC: 0 % (ref 0.0–0.2)

## 2020-12-30 LAB — FERRITIN: Ferritin: 30 ng/mL (ref 24–336)

## 2020-12-30 LAB — ANTITHROMBIN III: AntiThromb III Func: 120 % (ref 75–120)

## 2020-12-30 NOTE — Progress Notes (Signed)
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  His abdominal pain has completely resolved since the ED discharge.  He denies any other symptoms.  No clear reason for the splenic infarct such as abdominal trauma, history of splenic infection, history of blood clots or family history of hypercoagulable disorders. No known history of sickle cell disease.  He did have possible silent MI many years ago but none of these are recent. He overall feels well. Physical examination is quite unremarkable today.  No palpable lymphadenopathy or hepatosplenomegaly. I have reviewed his CT imaging and noticed the splenic infarct and other small areas of low-attenuation there is no stranding perisplenic.  At this time I wonder if this splenic infarct is an incidental finding and may not have been the source for the abdominal pain.  I do think he has ongoing gastritis and slow upper GI bleed which has caused abdominal pain and ongoing severe microcytic hypochromic anemia.  There does not seem to be any concern for splenic abscess or hypercoagulable disorder but given cryptogenic splenic infarct, have ordered hypercoagulable work-up.  I cannot place him on anticoagulation at this time because of concern for acute GI bleed.  I will try to call his gastroenterologist and try to expedite his appointment for sooner follow-up and  sooner upper GI endoscopy.  If hypercoagulable work-up is negative and since this is most likely a chronic finding, I do not believe anticoagulation is imperative here.   Microcytic hypochromic anemia  #2.  Microcytic hypochromic iron deficiency anemia likely ongoing acute GI bleed given his large hiatal hernia and history of GI bleed.  I recommended him to hold aspirin for the time being until he sees his gastroenterologist. Have also added CBC, iron panel and ferritin today.  If he does have severe iron deficiency, we can supplement intravenous iron in the interim to assist in management of anemia.  It is still vital that he follows up with gastroenterology as soon as possible.  I have briefly discussed about intravenous iron formularies and the minimal adverse effects and very rarely life-threatening anaphylaxis can be seen with intravenous iron.    I called [name] from [facility], she will discuss with the gastroenterologist and try to expedite his appointments. He will return to clinic in 3 weeks approximately to review labs and to discuss any additional recommendations.  Orders Placed This Encounter  Procedures  . CBC with Differential/Platelet    Standing Status:   Standing    Number of Occurrences:   22    Standing Expiration Date:   12/30/2021  . Iron and TIBC    Standing Status:   Future    Number of Occurrences:   1    Standing Expiration Date:   12/30/2021  . Ferritin    Standing Status:   Future    Number of Occurrences:   1    Standing Expiration Date:   12/30/2021  . Factor 5 leiden    Standing Status:  Future    Number of Occurrences:   1    Standing Expiration Date:   12/30/2021  . Prothrombin gene mutation  . Lupus anticoagulant panel    Standing Status:   Future    Number of Occurrences:   1    Standing Expiration Date:   12/30/2021  . Beta-2-glycoprotein i abs, IgG/M/A    Standing Status:   Future    Number of Occurrences:   1    Standing Expiration  Date:   12/30/2021  . Cardiolipin antibodies, IgG, IgM, IgA*    Standing Status:   Future    Number of Occurrences:   1    Standing Expiration Date:   12/30/2021  . Antithrombin III*    Standing Status:   Future    Number of Occurrences:   1    Standing Expiration Date:   12/30/2021  . Protein C activity*    Standing Status:   Future    Number of Occurrences:   1    Standing Expiration Date:   12/30/2021  . Protein S activity*    Standing Status:   Future    Number of Occurrences:   1    Standing Expiration Date:   12/30/2021  . JAK2 V617F, w Reflex to CALR/E12/MPL  . PNH Profile (-High Sensitivity)    Standing Status:   Future    Number of Occurrences:   1    Standing Expiration Date:   12/30/2021     HISTORY OF PRESENTING ILLNESS:  Jeremiah Calhoun 70 y.o. male is here because of Splenic infarct  This is a very pleasant 70 year old male patient with past medical history significant for hypertension, GI bleed, questionable silent myocardial infarction referred to hematology for evaluation of splenic infarction which was noticed to be an incidental finding when he recently presented to the hospital with abdominal pain. Patient started having severe stabbing abdominal pain mostly in the epigastrium and periumbilical after eating a can of sausages and went to the emergency room.  He had an imaging of his abdomen at that time which showed partially imaged large hiatal hernia, paraesophageal and sliding type splenic infarct along the anterior spleen with other areas of low attenuation potentially small infarcts as well no perisplenic stranding nonobstructive right nephrolithiasis sigmoid diverticular disease and pancolonic diverticulosis without any acute inflammation or acute appendicitis He was given some IV Protonix during the ER visit, IV pain medication and was recommended to follow-up with his gastroenterology outpatient.  He is scheduled to see his gastroenterologist first week of May.  He tells me  that since he came out of the emergency room his abdominal pain has completely resolved.  Prior to the ED visit he was taking Protonix as well but he thinks that IV Protonix might have helped.  He denies any prior history of abdominal trauma, history of DVT, PE or known hypercoagulable disorders in the family.  No B symptoms.  He relatively has less appetite than when he had many years ago but he denies any unintentional weight loss. He smokes 1 pack/day daily.  No alcohol intake he.  He had a GI bleed about 6 or 7 years ago and needed over 4 units of red blood cell transfusion. Rest of the pertinent 10 point ROS reviewed and negative.  REVIEW OF SYSTEMS:   Constitutional: Denies fevers, chills or abnormal night sweats Eyes: Denies blurriness of vision, double vision or watery eyes Ears, nose, mouth, throat, and face: Denies mucositis or sore  throat Respiratory: Denies cough, dyspnea or wheezes Cardiovascular: Denies palpitation, chest discomfort or lower extremity swelling Gastrointestinal:  Denies nausea, heartburn or change in bowel habits Skin: Denies abnormal skin rashes Lymphatics: Denies new lymphadenopathy or easy bruising Neurological:Denies numbness, tingling or new weaknesses Behavioral/Psych: Mood is stable, no new changes  All other systems were reviewed with the patient and are negative.  MEDICAL HISTORY:  Past Medical History:  Diagnosis Date  . Chest pain, exertional   . Chronic kidney disease    kidney stones  . Hyperlipidemia   . Hypertension     SURGICAL HISTORY: Past Surgical History:  Procedure Laterality Date  . COLONOSCOPY N/A 04/19/2014   BDZ:[id number] hemorrhoids. Colonic diverticulosis. Single colonic polyp removed as described above  . ESOPHAGOGASTRODUODENOSCOPY N/A 04/19/2014   STM:[id number] reflux esophagitis - benign-appearing peptic stricture  -  status post dilation as described above. 5 cm hiatal hernia. Gastric erosions.    Status post Maloney dilation  and gastric biopsy  . HERNIA REPAIR     X2    SOCIAL HISTORY: Social History   Socioeconomic History  . Marital status: Divorced    Spouse name: Not on file  . Number of children: Not on file  . Years of education: Not on file  . Highest education level: Not on file  Occupational History  . Not on file  Tobacco Use  . Smoking status: Former Smoker    Packs/day: 0.80    Years: 15.00    Pack years: 12.00    Types: Cigarettes    Quit date: 10/01/2013    Years since quitting: 7.2  . Smokeless tobacco: Not on file  . Tobacco comment: Patient is trying electronic cigarette.  Substance and Sexual Activity  . Alcohol use: Yes    Alcohol/week: 0.0 standard drinks    Comment: Rare  . Drug use: No  . Sexual activity: Not on file  Other Topics Concern  . Not on file  Social History Narrative  . Not on file   Social Determinants of Health   Financial Resource Strain: Not on file  Food Insecurity: Not on file  Transportation Needs: Not on file  Physical Activity: Not on file  Stress: Not on file  Social Connections: Not on file  Intimate Partner Violence: Not At Risk  . Fear of Current or Ex-Partner: No  . Emotionally Abused: No  . Physically Abused: No  . Sexually Abused: No    FAMILY HISTORY: Family History  Problem Relation Age of Onset  . Heart attack Mother 71       Cause of death  . Heart attack Father 24       Cause of death  . Hypertension Sister     ALLERGIES:  is allergic to codeine.  MEDICATIONS:  Current Outpatient Medications  Medication Sig Dispense Refill  . allopurinol (ZYLOPRIM) 100 MG tablet Take 200 mg by mouth daily.    Marland Kitchen amLODipine (NORVASC) 10 MG tablet TAKE 1/2 TABLET BY MOUTH DAILY. 15 tablet 0  . amLODipine (NORVASC) 5 MG tablet Take 5 mg by mouth daily.    . Ascorbic Acid (VITAMIN C) 1000 MG tablet Take 1,000 mg by mouth daily.    Marland Kitchen aspirin 81 MG tablet Take 81 mg by mouth daily.    . Coenzyme Q10 (CO Q 10) 100 MG CAPS Take 100 mg by  mouth daily.    . Cyclobenzaprine HCl (FLEXERIL PO)     . ferrous sulfate 325 (65 FE) MG tablet Take 325  mg by mouth daily with breakfast. 2 tabs daily    . Flaxseed, Linseed, (FLAX SEED OIL) 1000 MG CAPS Take 1,000 mg by mouth daily.    Marland Kitchen leflunomide (ARAVA) 20 MG tablet Take 20 mg by mouth daily.    Marland Kitchen lisinopril-hydrochlorothiazide (PRINZIDE,ZESTORETIC) 20-12.5 MG tablet TAKE 2 TABLETS BY MOUTH ONCE DAILY. 60 tablet 3  . Lycopene 10 MG CAPS Take 10 mg by mouth daily.    . metoprolol succinate (TOPROL-XL) 100 MG 24 hr tablet TAKE ONE TABLET BY MOUTH DAILY. TAKE WITH OR IMMEDIATELY FOLLOWING A MEAL. 30 tablet 0  . Multiple Vitamin (MULTIVITAMIN) tablet Take 1 tablet by mouth daily.    . Omega-3 Fatty Acids (FISH OIL) 1200 MG CAPS Take 1,200 mg by mouth daily.    . pantoprazole (PROTONIX) 40 MG tablet Take 1 tablet (40 mg total) by mouth 2 (two) times daily. 60 tablet 5  . potassium chloride (KLOR-CON) 10 MEQ tablet 1 tablet    . pravastatin (PRAVACHOL) 80 MG tablet Take 1 tablet (80 mg total) by mouth every evening. 30 tablet 11  . rosuvastatin (CRESTOR) 10 MG tablet 1 tablet    . saw palmetto 160 MG capsule Take 160 mg by mouth daily.    Marland Kitchen spironolactone (ALDACTONE) 25 MG tablet TAKE ONE TABLET BY MOUTH DAILY. 90 tablet 0  . tiZANidine (ZANAFLEX) 2 MG tablet Take 2 mg by mouth every 8 (eight) hours as needed.     No current facility-administered medications for this visit.     PHYSICAL EXAMINATION:  ECOG PERFORMANCE STATUS: 0 - Asymptomatic  Vitals:   12/30/20 0918  BP: 123/70  Pulse: 66  Resp: 17  Temp: 97.7 F (36.5 C)  SpO2: 98%   Filed Weights   12/30/20 0918  Weight: 206 lb 4 oz (93.6 kg)    GENERAL:alert, no distress and comfortable SKIN: skin color, texture, turgor are normal, no rashes or significant lesions EYES: normal, conjunctiva are pink and non-injected, sclera clear OROPHARYNX:no exudate, no erythema and lips, buccal mucosa, and tongue normal  NECK: supple,  thyroid normal size, non-tender, without nodularity LYMPH:  no palpable lymphadenopathy in the cervical, axillary or inguinal LUNGS: clear to auscultation and percussion with normal breathing effort HEART: regular rate & rhythm and no murmurs and no lower extremity edema ABDOMEN:abdomen soft, non-tender and normal bowel sounds Musculoskeletal:no cyanosis of digits and no clubbing  PSYCH: alert & oriented x 3 with fluent speech NEURO: no focal motor/sensory deficits  LABORATORY DATA:  I have reviewed the data as listed Lab Results  Component Value Date   WBC 4.5 12/19/2020   HGB 8.6 (L) 12/19/2020   HCT 29.3 (L) 12/19/2020   MCV 61.4 (L) 12/19/2020   PLT 230 12/19/2020     Chemistry      Component Value Date/Time   NA 137 12/19/2020 2024   K 3.2 (L) 12/19/2020 2024   CL 107 12/19/2020 2024   CO2 25 12/19/2020 2024   BUN 12 12/19/2020 2024   CREATININE 0.93 12/19/2020 2024   CREATININE 1.42 (H) 07/14/2015 1628      Component Value Date/Time   CALCIUM 7.9 (L) 12/19/2020 2024   ALKPHOS 58 12/19/2020 2024   AST 14 (L) 12/19/2020 2024   ALT 11 12/19/2020 2024   BILITOT 0.6 12/19/2020 2024       RADIOGRAPHIC STUDIES: I have personally reviewed the radiological images as listed and agreed with the findings in the report. CT ABDOMEN PELVIS W CONTRAST  Result Date: 12/19/2020  CLINICAL DATA:  Acute RIGHT upper quadrant pain, nonlocalized EXAM: CT ABDOMEN AND PELVIS WITH CONTRAST TECHNIQUE: Multidetector CT imaging of the abdomen and pelvis was performed using the standard protocol following bolus administration of intravenous contrast. CONTRAST:  183mL OMNIPAQUE IOHEXOL 300 MG/ML  SOLN COMPARISON:  Prior chest x-rays. FINDINGS: Lower chest: Partially imaged large hiatal hernia, paraesophageal and sliding type extending into the chest. Contour of the retrocardiac region on prior imaging suggests this is a chronic finding. No perigastric stranding. Stomach incompletely imaged. No  effusion. No consolidation. Hepatobiliary: No focal, suspicious hepatic lesion. No pericholecystic stranding. Portal vein is patent. Hepatic veins are patent. Pancreas: Pancreas without signs of ductal dilation, inflammation or contour abnormality. Spleen: Splenic infarct along the anterior spleen. Other areas of low attenuation potentially small infarcts as well. The largest area persists on delayed phase imaging. No perisplenic stranding. Adrenals/Urinary Tract: Mild thickening of the bilateral adrenal glands. No hydronephrosis. Small cysts in the bilateral kidneys. Renal sinus cysts on the LEFT. Nephrolithiasis in the RIGHT kidney, 7-8 mm calculus in the upper pole and a 12 mm calculus in the lower pole. Urinary bladder with smooth contours. No hydronephrosis. Stomach/Bowel: Sigmoid diverticular disease.  Normal appendix. Stomach herniated entirely into the chest, inverted with signs of large mixed type hernia. No acute small bowel process. Vascular/Lymphatic: Calcified atheromatous plaque in the nonaneurysmal abdominal aorta patent portal vein. There is no gastrohepatic or hepatoduodenal ligament lymphadenopathy. No retroperitoneal or mesenteric lymphadenopathy. No pelvic sidewall lymphadenopathy. Reproductive: Prostate unremarkable by CT. Other: No ascites. Mild skin thickening over the RIGHT lower abdomen with subtle stranding in the subcutaneous fat. Musculoskeletal: No acute musculoskeletal process. Spinal degenerative changes. Cystic area in the posterior back presumably large sebaceous cyst without surrounding stranding IMPRESSION: 1. Partially imaged large hiatal hernia, paraesophageal and sliding type extending into the chest. Contour of the retrocardiac region on prior imaging suggests this is a chronic finding. No perigastric stranding.w 2. Mild skin thickening over the RIGHT lower abdomen and subtle stranding in the subcutaneous fat. Correlate with any signs of cellulitis. 3. Splenic infarct along the  anterior spleen. Other areas of low attenuation potentially small infarcts as well. No perisplenic stranding. Correlate with site of pain. 4. Nonobstructive RIGHT nephrolithiasis. 5. Sigmoid diverticular disease and pancolonic diverticulosis. No signs of acute inflammation of the colon. Normal appendix. 6. Aortic atherosclerosis. Aortic Atherosclerosis (ICD10-I70.0). Electronically Signed   By: Zetta Bills M.D.   On: 12/19/2020 22:19   DG Chest Port 1 View  Result Date: 12/19/2020 CLINICAL DATA:  70 year old male with shortness of breath. EXAM: PORTABLE CHEST 1 VIEW COMPARISON:  Chest radiograph dated 04/16/2014 FINDINGS: No focal consolidation, pleural effusion, pneumothorax. The cardiac silhouette is within limits. There is a moderate size hiatal hernia. Atherosclerotic calcification of the aorta. No acute osseous pathology. IMPRESSION: 1. No acute cardiopulmonary process. 2. Hiatal hernia. Electronically Signed   By: Anner Crete M.D.   On: 12/19/2020 21:06    All questions were answered. The patient knows to call the clinic with any problems, questions or concerns. I spent 45 minutes in the care of this patient including H and P, review of records, counseling and coordination of care.     Benay Pike, MD 12/30/2020 10:21 AM

## 2020-12-30 NOTE — Progress Notes (Signed)
Repeat lab orders placed per Dr. Chryl Heck.

## 2020-12-30 NOTE — Assessment & Plan Note (Signed)
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  His abdominal pain has completely resolved since the ED discharge.  He denies any other symptoms.  No clear reason for the splenic infarct such as abdominal trauma, history of splenic infection, history of blood clots or family history of hypercoagulable disorders. No known history of sickle cell disease.  He did have possible silent MI many years ago but none of these are recent. He overall feels well. Physical examination is quite unremarkable today.  No palpable lymphadenopathy or hepatosplenomegaly. I have reviewed his CT imaging and noticed the splenic infarct and other small areas of low-attenuation there is no stranding perisplenic.  At this time I wonder if this splenic infarct is an incidental finding and may not have been the source for the abdominal pain.  I do think he has ongoing gastritis and slow upper GI bleed which has caused abdominal pain and ongoing severe microcytic hypochromic anemia.  There does not seem to be any concern for splenic abscess or hypercoagulable disorder but given cryptogenic splenic infarct, have ordered hypercoagulable work-up.  I cannot place him on anticoagulation at this time because of concern for acute GI bleed.  I will try to call his gastroenterologist and try to expedite his appointment for sooner follow-up and sooner upper GI endoscopy.  If hypercoagulable work-up is negative and since this is most likely a chronic finding, I do not believe anticoagulation is imperative here.

## 2020-12-30 NOTE — Assessment & Plan Note (Signed)
#  2.  Microcytic hypochromic iron deficiency anemia likely ongoing acute GI bleed given his large hiatal hernia and history of GI bleed.  I recommended him to hold aspirin for the time being until he sees his gastroenterologist. Have also added CBC, iron panel and ferritin today.  If he does have severe iron deficiency, we can supplement intravenous iron in the interim to assist in management of anemia.  It is still vital that he follows up with gastroenterology as soon as possible.  I have briefly discussed about intravenous iron formularies and the minimal adverse effects and very rarely life-threatening anaphylaxis can be seen with intravenous iron.

## 2020-12-30 NOTE — Telephone Encounter (Signed)
-----   Message from Benay Pike, MD sent at 12/30/2020  1:31 PM EDT ----- Good news, your hemoglobin is entirely normal today, Iron numbers are good too. No indication for iron infusion. Rest of the labs are pending, we will talk about them during your next appointment.

## 2020-12-31 LAB — LUPUS ANTICOAGULANT PANEL
DRVVT: 32.7 s (ref 0.0–47.0)
PTT Lupus Anticoagulant: 31.5 s (ref 0.0–51.9)

## 2020-12-31 LAB — PROTEIN C ACTIVITY: Protein C Activity: 138 % (ref 73–180)

## 2020-12-31 LAB — PROTEIN S ACTIVITY: Protein S Activity: 93 % (ref 63–140)

## 2021-01-01 LAB — BETA-2-GLYCOPROTEIN I ABS, IGG/M/A
Beta-2 Glyco I IgG: <9 GPI IgG units (ref 0–20)
Beta-2-Glycoprotein I IgA: <9 GPI IgA units (ref 0–25)
Beta-2-Glycoprotein I IgM: <9 GPI IgM units (ref 0–32)

## 2021-01-02 ENCOUNTER — Other Ambulatory Visit (HOSPITAL_COMMUNITY): Payer: Medicare HMO

## 2021-01-02 LAB — CARDIOLIPIN ANTIBODIES, IGG, IGM, IGA
Anticardiolipin IgA: <9 APL U/mL (ref 0–11)
Anticardiolipin IgG: 9 GPL U/mL (ref 0–14)
Anticardiolipin IgM: <9 MPL U/mL (ref 0–12)

## 2021-01-03 ENCOUNTER — Inpatient Hospital Stay (HOSPITAL_COMMUNITY): Payer: Medicare HMO

## 2021-01-03 ENCOUNTER — Encounter: Payer: Self-pay | Admitting: Gastroenterology

## 2021-01-03 ENCOUNTER — Telehealth: Payer: Self-pay | Admitting: *Deleted

## 2021-01-03 ENCOUNTER — Other Ambulatory Visit: Payer: Self-pay

## 2021-01-03 ENCOUNTER — Other Ambulatory Visit (HOSPITAL_COMMUNITY)
Admission: RE | Admit: 2021-01-03 | Discharge: 2021-01-03 | Disposition: A | Discharge status: Home / Self Care | Payer: Medicare HMO | Source: Ambulatory Visit | Attending: Gastroenterology | Admitting: Gastroenterology

## 2021-01-03 ENCOUNTER — Ambulatory Visit: Payer: Medicare HMO | Admitting: Gastroenterology

## 2021-01-03 VITALS — BP 120/82 | HR 71 | Temp 97.2°F | Ht 69.0 in | Wt 204.0 lb

## 2021-01-03 DIAGNOSIS — D509 Iron deficiency anemia, unspecified: Secondary | ICD-10-CM

## 2021-01-03 DIAGNOSIS — K449 Diaphragmatic hernia without obstruction or gangrene: Secondary | ICD-10-CM | POA: Insufficient documentation

## 2021-01-03 DIAGNOSIS — R195 Other fecal abnormalities: Secondary | ICD-10-CM | POA: Insufficient documentation

## 2021-01-03 DIAGNOSIS — D649 Anemia, unspecified: Secondary | ICD-10-CM | POA: Insufficient documentation

## 2021-01-03 LAB — CBC
HCT: 44.8 % (ref 39.0–52.0)
Hemoglobin: 14 g/dL (ref 13.0–17.0)
MCH: 31.5 pg (ref 26.0–34.0)
MCHC: 31.3 g/dL (ref 30.0–36.0)
MCV: 100.7 fL — ABNORMAL HIGH (ref 80.0–100.0)
Platelets: 201 10*3/uL (ref 150–400)
RBC: 4.45 MIL/uL (ref 4.22–5.81)
RDW: 13.6 % (ref 11.5–15.5)
WBC: 5.2 10*3/uL (ref 4.0–10.5)
nRBC: 0 % (ref 0.0–0.2)

## 2021-01-03 NOTE — Patient Instructions (Signed)
1. Please go for labs today at [facility], present the order form given today. 2. Upper endoscopy and colonoscopy as scheduled. See separate instructions.  3. Please call with recurrent abdominal pain.

## 2021-01-03 NOTE — Progress Notes (Signed)
Primary Care Physician:  [name], MD  Primary Gastroenterologist:  Garfield Cornea, MD   Chief Complaint  Patient presents with  . Abdominal Pain    Had episode 3/21 and went to ED. Was told he had blood clots in spleen, he also had blood in his stool but none since    HPI:  Jeremiah Calhoun is a 70 y.o. male with past medical history significant for hypertension, IDA with heme + stool requiring transfusion/work up 2015, questionable silent myocardial infarction, recent imaging showing splenic infarct, large hiatal hernia (paraesophageal and sliding-type) presenting here for further evaluation of abdominal pain and anemia.  Seen in the ED recently.  Patient presented to the ED 12/20/20 for acute onset upper abdominal pain associated with diaphoresis. Occurred 5 minutes after eating [employer]. Pain was severe.  He thought he was going to die.  Pain lasted until he received Dilaudid in the ED.  No recurrent pain.  This was the only time he has ever experienced this.  He has chronic reflux.  Takes pantoprazole 40 mg twice daily.  Denies dysphagia.  Bowel movements are regular.  No blood in the stool or melena.  States has been on chronic oral iron daily since 2015 when he presented with his profound IDA.  While in the ED, rectal exam showed heme positive stool.  Labs showed BUN 12, creatinine 0.93, hemoglobin 8.6, hematocrit 29.3, MCV 61.4.  Chest x-ray showed hiatal hernia.  CT abdomen pelvis with contrast showed partially imaged large hiatal hernia, paraesophageal and sliding-type.  Splenic infarct along the anterior spleen.  Other areas of low-attenuation potentially small infarcts as well.  Nonobstructive right nephrolithiasis.  Normal appendix.  Colonic diverticulosis.  Repeat labs April 1: Hemoglobin 14.2, hematocrit 46.5, MCV 100.9, iron 153, iron saturations 37%, TIBC 418, ferritin 30.  Patient was not transfused.  No iron infusions.  Seen by hematology, hypercoagulable work-up in  progress.  It was felt that splenic infarct possibly chronic finding, given concern for possible GI bleeding as well, anticoagulation not felt to be imperative per hematology oncology.  Patient has some additional lab work planned today with follow-up in the next few weeks with them.  EGD and colonoscopy July 2015: Erosive reflux esophagitis, benign-appearing peptic stricture status post dilation, 5 cm hiatal hernia, gastric erosions.  Gastric biopsies benign with no H. pylori.  Internal hemorrhoids.  Colonic diverticulosis.  Single colonic polyp removed, did not survive processing, recommended 5-year surveillance colonoscopy.  Current Outpatient Medications  Medication Sig Dispense Refill  . allopurinol (ZYLOPRIM) 100 MG tablet Take 200 mg by mouth daily.    Marland Kitchen amLODipine (NORVASC) 5 MG tablet Take 5 mg by mouth daily.    . Ascorbic Acid (VITAMIN C) 1000 MG tablet Take 1,000 mg by mouth daily.    Marland Kitchen aspirin 81 MG tablet Take 81 mg by mouth daily.    . Coenzyme Q10 (CO Q 10) 100 MG CAPS Take 100 mg by mouth daily.    . ferrous sulfate 325 (65 FE) MG tablet Take 325 mg by mouth daily with breakfast.    . Flaxseed, Linseed, (FLAX SEED OIL) 1000 MG CAPS Take 1,000 mg by mouth daily.    Marland Kitchen leflunomide (ARAVA) 20 MG tablet Take 20 mg by mouth daily.    Marland Kitchen lisinopril-hydrochlorothiazide (PRINZIDE,ZESTORETIC) 20-12.5 MG tablet TAKE 2 TABLETS BY MOUTH ONCE DAILY. 60 tablet 3  . Lycopene 10 MG CAPS Take 10 mg by mouth daily.    . metoprolol succinate (TOPROL-XL) 100 MG  24 hr tablet TAKE ONE TABLET BY MOUTH DAILY. TAKE WITH OR IMMEDIATELY FOLLOWING A MEAL. 30 tablet 0  . Multiple Vitamin (MULTIVITAMIN) tablet Take 1 tablet by mouth daily.    . Omega-3 Fatty Acids (FISH OIL) 1200 MG CAPS Take 1,200 mg by mouth daily.    . pantoprazole (PROTONIX) 40 MG tablet Take 1 tablet (40 mg total) by mouth 2 (two) times daily. 60 tablet 5  . rosuvastatin (CRESTOR) 10 MG tablet Take 10 mg by mouth daily.    . saw palmetto  160 MG capsule Take 160 mg by mouth daily.    Marland Kitchen spironolactone (ALDACTONE) 25 MG tablet TAKE ONE TABLET BY MOUTH DAILY. 90 tablet 0  . tiZANidine (ZANAFLEX) 2 MG tablet Take 2 mg by mouth every 8 (eight) hours as needed.     No current facility-administered medications for this visit.    Allergies as of 01/03/2021 - Review Complete 01/03/2021  Allergen Reaction Noted  . Codeine Nausea And Vomiting 06/25/2012    Past Medical History:  Diagnosis Date  . Chest pain, exertional   . Chronic kidney disease    kidney stones  . Hiatal hernia   . Hyperlipidemia   . Hypertension   . Skin cancer   . Splenic infarct     Past Surgical History:  Procedure Laterality Date  . COLONOSCOPY N/A 04/19/2014   [id number] hemorrhoids. Colonic diverticulosis. Single colonic polyp removed as described above  . ESOPHAGOGASTRODUODENOSCOPY N/A 04/19/2014   MVH:[id number] reflux esophagitis - benign-appearing peptic stricture  -  status post dilation as described above. 5 cm hiatal hernia. Gastric erosions.    Status post Maloney dilation and gastric biopsy  . HERNIA REPAIR     X2    Family History  Problem Relation Age of Onset  . Heart attack Mother 64       Cause of death  . Heart attack Father 74       Cause of death  . Hypertension Sister   . Breast cancer Sister   . Skin cancer Paternal Aunt   . Colon cancer Neg Hx     Social History   Socioeconomic History  . Marital status: Divorced    Spouse name: Not on file  . Number of children: Not on file  . Years of education: Not on file  . Highest education level: Not on file  Occupational History  . Not on file  Tobacco Use  . Smoking status: Current Every Day Smoker    Packs/day: 0.80    Years: 15.00    Pack years: 12.00    Types: Cigarettes    Last attempt to quit: 10/01/2013    Years since quitting: 7.2  . Smokeless tobacco: Never Used  Substance and Sexual Activity  . Alcohol use: Yes    Alcohol/week: 0.0 standard drinks     Comment: Rare  . Drug use: No  . Sexual activity: Not on file  Other Topics Concern  . Not on file  Social History Narrative  . Not on file   Social Determinants of Health   Financial Resource Strain: Not on file  Food Insecurity: Not on file  Transportation Needs: Not on file  Physical Activity: Not on file  Stress: Not on file  Social Connections: Not on file  Intimate Partner Violence: Not At Risk  . Fear of Current or Ex-Partner: No  . Emotionally Abused: No  . Physically Abused: No  . Sexually Abused: No      ROS:  General: Negative for anorexia, weight loss, fever, chills, fatigue, weakness. Eyes: Negative for vision changes.  ENT: Negative for hoarseness, difficulty swallowing , nasal congestion. CV: Negative for chest pain, angina, palpitations, dyspnea on exertion, peripheral edema.  Respiratory: Negative for dyspnea at rest, dyspnea on exertion, cough, sputum, wheezing.  GI: See history of present illness. GU:  Negative for dysuria, hematuria, urinary incontinence, urinary frequency, nocturnal urination.  MS: Negative for joint pain, low back pain.  Derm: Negative for rash or itching.  Neuro: Negative for weakness, abnormal sensation, seizure, frequent headaches, memory loss, confusion.  Psych: Negative for anxiety, depression, suicidal ideation, hallucinations.  Endo: Negative for unusual weight change.  Heme: Negative for bruising or bleeding. Allergy: Negative for rash or hives.    Physical Examination:  BP 120/82   Pulse 71   Temp (!) 97.2 F (36.2 C)   Ht 5\' 9"  (1.753 m)   Wt 204 lb (92.5 kg)   BMI 30.13 kg/m    General: Well-nourished, well-developed in no acute distress.  Head: Normocephalic, atraumatic.   Eyes: Conjunctiva pink, no icterus. Mouth: masked Neck: Supple without thyromegaly, masses, or lymphadenopathy.  Lungs: Clear to auscultation bilaterally.  Heart: Regular rate and rhythm, no murmurs rubs or gallops.  Abdomen: Bowel sounds  are normal, nontender, nondistended, no hepatosplenomegaly or masses, no abdominal bruits or    hernia , no rebound or guarding.   Rectal: not performed Extremities: No lower extremity edema. No clubbing or deformities.  Neuro: Alert and oriented x 4 , grossly normal neurologically.  Skin: Warm and dry, no rash or jaundice.   Psych: Alert and cooperative, normal mood and affect.  Labs: Lab Results  Component Value Date   CREATININE 0.93 12/19/2020   BUN 12 12/19/2020   NA 137 12/19/2020   K 3.2 (L) 12/19/2020   CL 107 12/19/2020   CO2 25 12/19/2020   Lab Results  Component Value Date   ALT 11 12/19/2020   AST 14 (L) 12/19/2020   ALKPHOS 58 12/19/2020   BILITOT 0.6 12/19/2020   Lab Results  Component Value Date   WBC 5.7 12/30/2020   HGB 14.2 12/30/2020   HCT 46.5 12/30/2020   MCV 100.9 (H) 12/30/2020   PLT 204 12/30/2020   Lab Results  Component Value Date   IRON 153 12/30/2020   TIBC 418 12/30/2020   FERRITIN 30 12/30/2020   Lab Results  Component Value Date   LIPASE 26 12/19/2020      Imaging Studies: CT ABDOMEN PELVIS W CONTRAST  Result Date: 12/19/2020 CLINICAL DATA:  Acute RIGHT upper quadrant pain, nonlocalized EXAM: CT ABDOMEN AND PELVIS WITH CONTRAST TECHNIQUE: Multidetector CT imaging of the abdomen and pelvis was performed using the standard protocol following bolus administration of intravenous contrast. CONTRAST:  156mL OMNIPAQUE IOHEXOL 300 MG/ML  SOLN COMPARISON:  Prior chest x-rays. FINDINGS: Lower chest: Partially imaged large hiatal hernia, paraesophageal and sliding type extending into the chest. Contour of the retrocardiac region on prior imaging suggests this is a chronic finding. No perigastric stranding. Stomach incompletely imaged. No effusion. No consolidation. Hepatobiliary: No focal, suspicious hepatic lesion. No pericholecystic stranding. Portal vein is patent. Hepatic veins are patent. Pancreas: Pancreas without signs of ductal dilation,  inflammation or contour abnormality. Spleen: Splenic infarct along the anterior spleen. Other areas of low attenuation potentially small infarcts as well. The largest area persists on delayed phase imaging. No perisplenic stranding. Adrenals/Urinary Tract: Mild thickening of the bilateral adrenal glands. No hydronephrosis. Small cysts in the  bilateral kidneys. Renal sinus cysts on the LEFT. Nephrolithiasis in the RIGHT kidney, 7-8 mm calculus in the upper pole and a 12 mm calculus in the lower pole. Urinary bladder with smooth contours. No hydronephrosis. Stomach/Bowel: Sigmoid diverticular disease.  Normal appendix. Stomach herniated entirely into the chest, inverted with signs of large mixed type hernia. No acute small bowel process. Vascular/Lymphatic: Calcified atheromatous plaque in the nonaneurysmal abdominal aorta patent portal vein. There is no gastrohepatic or hepatoduodenal ligament lymphadenopathy. No retroperitoneal or mesenteric lymphadenopathy. No pelvic sidewall lymphadenopathy. Reproductive: Prostate unremarkable by CT. Other: No ascites. Mild skin thickening over the RIGHT lower abdomen with subtle stranding in the subcutaneous fat. Musculoskeletal: No acute musculoskeletal process. Spinal degenerative changes. Cystic area in the posterior back presumably large sebaceous cyst without surrounding stranding IMPRESSION: 1. Partially imaged large hiatal hernia, paraesophageal and sliding type extending into the chest. Contour of the retrocardiac region on prior imaging suggests this is a chronic finding. No perigastric stranding.w 2. Mild skin thickening over the RIGHT lower abdomen and subtle stranding in the subcutaneous fat. Correlate with any signs of cellulitis. 3. Splenic infarct along the anterior spleen. Other areas of low attenuation potentially small infarcts as well. No perisplenic stranding. Correlate with site of pain. 4. Nonobstructive RIGHT nephrolithiasis. 5. Sigmoid diverticular disease  and pancolonic diverticulosis. No signs of acute inflammation of the colon. Normal appendix. 6. Aortic atherosclerosis. Aortic Atherosclerosis (ICD10-I70.0). Electronically Signed   By: Zetta Bills M.D.   On: 12/19/2020 22:19   DG Chest Port 1 View  Result Date: 12/19/2020 CLINICAL DATA:  70 year old male with shortness of breath. EXAM: PORTABLE CHEST 1 VIEW COMPARISON:  Chest radiograph dated 04/16/2014 FINDINGS: No focal consolidation, pleural effusion, pneumothorax. The cardiac silhouette is within limits. There is a moderate size hiatal hernia. Atherosclerotic calcification of the aorta. No acute osseous pathology. IMPRESSION: 1. No acute cardiopulmonary process. 2. Hiatal hernia. Electronically Signed   By: Anner Crete M.D.   On: 12/19/2020 21:06    Assessment/Plan:  Pleasant 69 year old man presenting for further evaluation of abdominal pain, anemia.  Abdominal pain: Recent acute onset abdominal pain lasting for hours.  CT with findings of large hiatal hernia, paraesophageal and sliding-type, splenic infarct with potentially smaller infarcts noted as well. Has seen hematology/oncology.It is felt that his splenic infarct could potentially be incidental finding, chronic in nature. It is not clear that his recent abdominal pain is related to splenic infarct. Would be concerned his abdominal pain was more related to his large hiatal hernia with possible temporary strangulation.  At this time he has had no recurrent symptoms.  Microcytic anemia: history of remote IDA in 2015 as outlined above. Completed EGD/TCS at that time. Recently in ED, Hgb 8.6, MCV 61.4. repeat labs 3 weeks later with Hgb 14.2, MCV 100.0, ferritin 30. Chronically takes iron for past 7 years. He was heme positive in the ER. No overt GI bleeding. Question lab error.   H/O colon polyp: He is overdue for surveillance colonoscopy given history of polyp (polyp did not survive processing).  1. Colonoscopy (for h/o colon  polyp) and upper endoscopy (for abd pain and evaluate hiatal hernia) with Dr. [name] with propofol (given amount of conscious sedation required previously), ASA II.  I have discussed the risks, alternatives, benefits with regards to but not limited to the risk of reaction to medication, bleeding, infection, perforation and the patient is agreeable to proceed. Written consent to be obtained. 2. Repeat CBC today.  3. Continue pantoprazole 40mg  BID  for now. 4. Continue oral iron (patient has been on for 7 years). No evidence of iron overload. 5. Call with recurrent abdominal pain.

## 2021-01-03 NOTE — Telephone Encounter (Signed)
Offered patient to schedule ASAP procedure TCS/EGD with Dr. [name], propofol ASA 2 on Thursday 4/7 but declined stating his gf has to have nerve block damage surgery tomorrow. Also offered 5/5. He is unsure when he can do procedure. He did not schedule anything at this time because he states he has to see how she does after surgery. He is going to call us to schedule when he can. I advised procedure was ordered as ASAP so he will call as soon as he can. FYI to [place].

## 2021-01-06 LAB — PNH PROFILE (-HIGH SENSITIVITY)

## 2021-01-09 ENCOUNTER — Other Ambulatory Visit (HOSPITAL_COMMUNITY): Payer: Medicare HMO

## 2021-01-09 LAB — PROTHROMBIN GENE MUTATION

## 2021-01-09 LAB — FACTOR 5 LEIDEN

## 2021-01-09 NOTE — Telephone Encounter (Signed)
OK. Let's make sure we keep his information available to follow up. Don't want him to be lost to follow up.

## 2021-01-13 LAB — JAK2 V617F, W REFLEX TO CALR/E12/MPL

## 2021-01-13 LAB — CALR + JAK2 E12-15 + MPL (REFLEXED)

## 2021-01-19 NOTE — Telephone Encounter (Signed)
Tried to call pt to see if he's ready to schedule procedure, LMOVM for return call.

## 2021-01-26 ENCOUNTER — Other Ambulatory Visit: Payer: Self-pay

## 2021-01-26 ENCOUNTER — Telehealth: Payer: Self-pay | Admitting: Internal Medicine

## 2021-01-26 MED ORDER — PEG 3350-KCL-NA BICARB-NACL 420 G PO SOLR: 4000.0000 mL | ORAL | 0 refills | Status: DC

## 2021-01-26 NOTE — Telephone Encounter (Signed)
Called pt, TCS/EGD w/Propofol w/Dr. [name] ASA 2 scheduled for 03/02/21 at 7:30am. Covid test 02/28/21 at 10:30am. Rx for prep sent to pharmacy. Orders entered. Appt letter mailed with procedure instructions.

## 2021-01-26 NOTE — Telephone Encounter (Signed)
[phone]   PATIENT RETURNED CALL

## 2021-01-27 ENCOUNTER — Other Ambulatory Visit: Payer: Self-pay

## 2021-01-27 ENCOUNTER — Encounter (HOSPITAL_COMMUNITY): Payer: Self-pay | Admitting: Hematology and Oncology

## 2021-01-27 ENCOUNTER — Inpatient Hospital Stay (HOSPITAL_BASED_OUTPATIENT_CLINIC_OR_DEPARTMENT_OTHER): Payer: Medicare HMO | Admitting: Hematology and Oncology

## 2021-01-27 VITALS — BP 101/60 | HR 73 | Temp 98.0°F | Resp 18 | Wt 203.3 lb

## 2021-01-27 DIAGNOSIS — D509 Iron deficiency anemia, unspecified: Secondary | ICD-10-CM | POA: Diagnosis not present

## 2021-01-27 DIAGNOSIS — D735 Infarction of spleen: Secondary | ICD-10-CM | POA: Diagnosis not present

## 2021-01-27 NOTE — Progress Notes (Signed)
[name] NOTE  Patient Care Team: [name], MD as PCP - General (Internal Medicine) Rothbart, [name], MD (Inactive) (Cardiology) [name], MD (Internal Medicine) [name] [name], MD as Consulting Physician (Gastroenterology)  CHIEF COMPLAINTS/PURPOSE OF CONSULTATION:  Splenic infarct noted on CT abdomen imaging  ASSESSMENT & PLAN:  Splenic infarct This is a very pleasant 70 year old male patient with past medical history significant for hypertension, GI bleed referred to hematology for evaluation of splenic infarct noticed on abdominal imaging when he recently presented to the ER with severe abdominal pain.  Besides that one episode of abdominal pain, he had no complaints. He feels very healthy. No concerning ROS. We discussed hypercoagulable work up which showed no evidence of coagulopathy. At this time, since its not clear if its incidental finding vs actual cause of abdominal pain, and since he had no other episodes, no evidence of coagulopathy, I didn't recommend any anticoagulation He can continue baby aspirin for cardiovascular protectioN Recommend FU with GI team. He is not up to date with age appropriate cancer screening  Microcytic hypochromic anemia Microcytosis and anemia has resolved In fact his most recent labs showed slight macrocytosis. We will continue to monitor this for now.   HISTORY OF PRESENTING ILLNESS:  Jeremiah Calhoun 70 y.o. male is here because of Splenic infarct  This is a very pleasant 70 year old male patient with past medical history significant for hypertension, GI bleed, questionable silent myocardial infarction referred to hematology for evaluation of splenic infarction   He smokes 1 pack/day daily.  No alcohol intake he.  He had a GI bleed about 6 or 7 years ago and needed over 4 units of red blood cell transfusion.  Interval History  During his last visit, we discussed about hypercoagulable work up and follow  up. He is here for follow up, doing well. No complaints today. NO more episodes of abdominal pain NO B symptoms No change in breathing, bowel or urinary habits.  Rest of the pertinent 10 point ROS reviewed and negative.  REVIEW OF SYSTEMS:   Constitutional: Denies fevers, chills or abnormal night sweats Eyes: Denies blurriness of vision, double vision or watery eyes Ears, nose, mouth, throat, and face: Denies mucositis or sore throat Respiratory: Denies cough, dyspnea or wheezes Cardiovascular: Denies palpitation, chest discomfort or lower extremity swelling Gastrointestinal:  Denies nausea, heartburn or change in bowel habits Skin: Denies abnormal skin rashes Lymphatics: Denies new lymphadenopathy or easy bruising Neurological:Denies numbness, tingling or new weaknesses Behavioral/Psych: Mood is stable, no new changes  All other systems were reviewed with the patient and are negative.  MEDICAL HISTORY:  Past Medical History:  Diagnosis Date  . Chest pain, exertional   . Chronic kidney disease    kidney stones  . Hiatal hernia   . Hyperlipidemia   . Hypertension   . Skin cancer   . Splenic infarct     SURGICAL HISTORY: Past Surgical History:  Procedure Laterality Date  . COLONOSCOPY N/A 04/19/2014   [id number] hemorrhoids. Colonic diverticulosis. Single colonic polyp removed as described above  . ESOPHAGOGASTRODUODENOSCOPY N/A 04/19/2014   [id number]:[id number] reflux esophagitis - benign-appearing peptic stricture  -  status post dilation as described above. 5 cm hiatal hernia. Gastric erosions.    Status post Maloney dilation and gastric biopsy  . HERNIA REPAIR     X2    SOCIAL HISTORY: Social History   Socioeconomic History  . Marital status: Divorced    Spouse name: Not on file  .  Number of children: Not on file  . Years of education: Not on file  . Highest education level: Not on file  Occupational History  . Not on file  Tobacco Use  . Smoking status: Current  Every Day Smoker    Packs/day: 0.80    Years: 15.00    Pack years: 12.00    Types: Cigarettes    Last attempt to quit: 10/01/2013    Years since quitting: 7.3  . Smokeless tobacco: Never Used  Substance and Sexual Activity  . Alcohol use: Yes    Alcohol/week: 0.0 standard drinks    Comment: Rare  . Drug use: No  . Sexual activity: Not on file  Other Topics Concern  . Not on file  Social History Narrative  . Not on file   Social Determinants of Health   Financial Resource Strain: Not on file  Food Insecurity: Not on file  Transportation Needs: Not on file  Physical Activity: Not on file  Stress: Not on file  Social Connections: Not on file  Intimate Partner Violence: Not At Risk  . Fear of Current or Ex-Partner: No  . Emotionally Abused: No  . Physically Abused: No  . Sexually Abused: No    FAMILY HISTORY: Family History  Problem Relation Age of Onset  . Heart attack Mother 3       Cause of death  . Heart attack Father 50       Cause of death  . Hypertension Sister   . Breast cancer Sister   . Skin cancer Paternal Aunt   . Colon cancer Neg Hx     ALLERGIES:  is allergic to codeine.  MEDICATIONS:  Current Outpatient Medications  Medication Sig Dispense Refill  . allopurinol (ZYLOPRIM) 100 MG tablet Take 200 mg by mouth daily.    Marland Kitchen amLODipine (NORVASC) 5 MG tablet Take 5 mg by mouth daily.    . Ascorbic Acid (VITAMIN C) 1000 MG tablet Take 1,000 mg by mouth daily.    Marland Kitchen aspirin 81 MG tablet Take 81 mg by mouth daily.    . Coenzyme Q10 (CO Q 10) 100 MG CAPS Take 100 mg by mouth daily.    . ferrous sulfate 325 (65 FE) MG tablet Take 325 mg by mouth daily with breakfast.    . Flaxseed, Linseed, (FLAX SEED OIL) 1000 MG CAPS Take 1,000 mg by mouth daily.    Marland Kitchen leflunomide (ARAVA) 20 MG tablet Take 20 mg by mouth daily.    Marland Kitchen lisinopril-hydrochlorothiazide (PRINZIDE,ZESTORETIC) 20-12.5 MG tablet TAKE 2 TABLETS BY MOUTH ONCE DAILY. 60 tablet 3  . Lycopene 10 MG CAPS  Take 10 mg by mouth daily.    . metoprolol succinate (TOPROL-XL) 100 MG 24 hr tablet TAKE ONE TABLET BY MOUTH DAILY. TAKE WITH OR IMMEDIATELY FOLLOWING A MEAL. 30 tablet 0  . Multiple Vitamin (MULTIVITAMIN) tablet Take 1 tablet by mouth daily.    . Omega-3 Fatty Acids (FISH OIL) 1200 MG CAPS Take 1,200 mg by mouth daily.    . pantoprazole (PROTONIX) 40 MG tablet Take 1 tablet (40 mg total) by mouth 2 (two) times daily. 60 tablet 5  . polyethylene glycol-electrolytes (TRILYTE) 420 g solution Take 4,000 mLs by mouth as directed. 4000 mL 0  . rosuvastatin (CRESTOR) 10 MG tablet Take 10 mg by mouth daily.    . saw palmetto 160 MG capsule Take 160 mg by mouth daily.    Marland Kitchen spironolactone (ALDACTONE) 25 MG tablet TAKE ONE TABLET BY MOUTH  DAILY. 90 tablet 0  . sulfamethoxazole-trimethoprim (BACTRIM DS) 800-160 MG tablet Take 1 tablet by mouth 2 (two) times daily.    Marland Kitchen tiZANidine (ZANAFLEX) 2 MG tablet Take 2 mg by mouth every 8 (eight) hours as needed.     No current facility-administered medications for this visit.     PHYSICAL EXAMINATION:  ECOG PERFORMANCE STATUS: 0 - Asymptomatic  Vitals:   01/27/21 1153  BP: 101/60  Pulse: 73  Resp: 18  Temp: 98 F (36.7 C)  SpO2: 98%   Filed Weights   01/27/21 1153  Weight: 203 lb 4.8 oz (92.2 kg)    PE deferred in lieu of counseling  LABORATORY DATA:  I have reviewed the data as listed Lab Results  Component Value Date   WBC 5.2 01/03/2021   HGB 14.0 01/03/2021   HCT 44.8 01/03/2021   MCV 100.7 (H) 01/03/2021   PLT 201 01/03/2021     Chemistry      Component Value Date/Time   NA 137 12/19/2020 2024   K 3.2 (L) 12/19/2020 2024   CL 107 12/19/2020 2024   CO2 25 12/19/2020 2024   BUN 12 12/19/2020 2024   CREATININE 0.93 12/19/2020 2024   CREATININE 1.42 (H) 07/14/2015 1628      Component Value Date/Time   CALCIUM 7.9 (L) 12/19/2020 2024   ALKPHOS 58 12/19/2020 2024   AST 14 (L) 12/19/2020 2024   ALT 11 12/19/2020 2024    BILITOT 0.6 12/19/2020 2024     I have reviewed his hypercoagulable panel which was unremarkable.  RADIOGRAPHIC STUDIES: I have personally reviewed the radiological images as listed and agreed with the findings in the report. No results found.  All questions were answered. The patient knows to call the clinic with any problems, questions or concerns. I spent 20 minutes in the care of this patient including H and P, review of records, counseling and coordination of care.     Benay Pike, MD 01/27/2021 12:47 PM

## 2021-01-27 NOTE — Assessment & Plan Note (Signed)
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  Besides that one episode of abdominal pain, he had no complaints. He feels very healthy. No concerning ROS. We discussed hypercoagulable work up which showed no evidence of coagulopathy. At this time, since its not clear if its incidental finding vs actual cause of abdominal pain, and since he had no other episodes, no evidence of coagulopathy, I didn't recommend any anticoagulation He can continue baby aspirin for cardiovascular protectioN Recommend FU with GI team. He is not up to date with age appropriate cancer screening

## 2021-01-27 NOTE — Assessment & Plan Note (Addendum)
Microcytosis and anemia has resolved In fact his most recent labs showed slight macrocytosis. We will continue to monitor this for now. Since his most recent ferritin is 30, he can continue oral iron supplementation until he comes back for FU

## 2021-01-31 ENCOUNTER — Ambulatory Visit: Payer: Medicare HMO | Admitting: Nurse Practitioner

## 2021-02-28 ENCOUNTER — Other Ambulatory Visit (HOSPITAL_COMMUNITY)
Admission: RE | Admit: 2021-02-28 | Discharge: 2021-02-28 | Disposition: A | Discharge status: Home / Self Care | Payer: Medicare HMO | Source: Ambulatory Visit | Attending: Internal Medicine | Admitting: Internal Medicine

## 2021-02-28 ENCOUNTER — Other Ambulatory Visit: Payer: Self-pay

## 2021-02-28 DIAGNOSIS — Z01812 Encounter for preprocedural laboratory examination: Secondary | ICD-10-CM | POA: Insufficient documentation

## 2021-03-01 ENCOUNTER — Encounter (HOSPITAL_COMMUNITY): Payer: Self-pay | Admitting: Anesthesiology

## 2021-03-01 ENCOUNTER — Telehealth: Payer: Self-pay | Admitting: *Deleted

## 2021-03-01 DIAGNOSIS — Z01812 Encounter for preprocedural laboratory examination: Secondary | ICD-10-CM | POA: Diagnosis present

## 2021-03-01 LAB — BASIC METABOLIC PANEL
Anion gap: 7 (ref 5–15)
BUN: 35 mg/dL — ABNORMAL HIGH (ref 8–23)
CO2: 23 mmol/L (ref 22–32)
Calcium: 9.3 mg/dL (ref 8.9–10.3)
Chloride: 105 mmol/L (ref 98–111)
Creatinine, Ser: 1.74 mg/dL — ABNORMAL HIGH (ref 0.61–1.24)
GFR, Estimated: 42 mL/min — ABNORMAL LOW (ref >60)
Glucose, Bld: 112 mg/dL — ABNORMAL HIGH (ref 70–99)
Potassium: 4.3 mmol/L (ref 3.5–5.1)
Sodium: 135 mmol/L (ref 135–145)

## 2021-03-01 NOTE — Telephone Encounter (Signed)
Received call from pt. He did not start clear liquids as advised. He didn;t read his instructions. He has also already ate breakfast and lunch today. Advised will need to r/s and will call once we receive Dr. Roseanne Kaufman future schedule. Message sent to endo also.

## 2021-03-02 ENCOUNTER — Ambulatory Visit (HOSPITAL_COMMUNITY): Admission: RE | Admit: 2021-03-02 | Payer: Medicare HMO | Source: Home / Self Care | Admitting: Internal Medicine

## 2021-03-02 SURGERY — COLONOSCOPY WITH PROPOFOL
Anesthesia: Monitor Anesthesia Care

## 2021-03-14 ENCOUNTER — Telehealth: Payer: Self-pay | Admitting: Internal Medicine

## 2021-03-14 NOTE — Telephone Encounter (Signed)
[phone]  PLEASE CALL PATIENT, HE WANTED TO KNOW WHEN HIS PROCEDURE WILL BE SCHEDULED

## 2021-03-14 NOTE — Telephone Encounter (Signed)
Called pt and made aware we are awaiting provider future schedule. He voiced understanding

## 2021-03-20 MED ORDER — PEG 3350-KCL-NA BICARB-NACL 420 G PO SOLR: ORAL | 0 refills | Status: DC

## 2021-03-20 NOTE — Telephone Encounter (Signed)
Called pt. He has been scheduled for 8/29 at 10:00am. Aware will mail new instructions. Confirmed address. Confirmed pharmacy. Aware will send in new Rx to pharmacy. He is aware that he will need to start clear liquids at none on Saturday 8/27. He voiced understanding.

## 2021-03-20 NOTE — Addendum Note (Signed)
Addended by: Cheron Every on: 03/20/2021 02:39 PM   Modules accepted: Orders

## 2021-05-26 ENCOUNTER — Other Ambulatory Visit (HOSPITAL_COMMUNITY)
Admission: RE | Admit: 2021-05-26 | Discharge: 2021-05-26 | Disposition: A | Discharge status: Home / Self Care | Payer: Medicare HMO | Source: Ambulatory Visit | Attending: Internal Medicine | Admitting: Internal Medicine

## 2021-05-26 ENCOUNTER — Other Ambulatory Visit: Payer: Self-pay

## 2021-05-26 ENCOUNTER — Other Ambulatory Visit: Payer: Self-pay | Admitting: *Deleted

## 2021-05-26 DIAGNOSIS — D649 Anemia, unspecified: Secondary | ICD-10-CM

## 2021-05-26 DIAGNOSIS — R195 Other fecal abnormalities: Secondary | ICD-10-CM | POA: Diagnosis present

## 2021-05-26 LAB — BASIC METABOLIC PANEL
Anion gap: 6 (ref 5–15)
BUN: 35 mg/dL — ABNORMAL HIGH (ref 8–23)
CO2: 25 mmol/L (ref 22–32)
Calcium: 9.1 mg/dL (ref 8.9–10.3)
Chloride: 104 mmol/L (ref 98–111)
Creatinine, Ser: 1.8 mg/dL — ABNORMAL HIGH (ref 0.61–1.24)
GFR, Estimated: 40 mL/min — ABNORMAL LOW (ref >60)
Glucose, Bld: 126 mg/dL — ABNORMAL HIGH (ref 70–99)
Potassium: 3.9 mmol/L (ref 3.5–5.1)
Sodium: 135 mmol/L (ref 135–145)

## 2021-05-29 ENCOUNTER — Encounter (HOSPITAL_COMMUNITY)
Admission: RE | Disposition: A | Discharge status: Home / Self Care | Payer: Self-pay | Source: Home / Self Care | Attending: Internal Medicine

## 2021-05-29 ENCOUNTER — Encounter (HOSPITAL_COMMUNITY): Payer: Self-pay | Admitting: Internal Medicine

## 2021-05-29 ENCOUNTER — Other Ambulatory Visit: Payer: Self-pay

## 2021-05-29 ENCOUNTER — Ambulatory Visit (HOSPITAL_COMMUNITY)
Admission: RE | Admit: 2021-05-29 | Discharge: 2021-05-29 | Disposition: A | Discharge status: Home / Self Care | Payer: Medicare HMO | Attending: Internal Medicine | Admitting: Internal Medicine

## 2021-05-29 ENCOUNTER — Ambulatory Visit (HOSPITAL_COMMUNITY): Payer: Medicare HMO | Admitting: Anesthesiology

## 2021-05-29 DIAGNOSIS — Z8249 Family history of ischemic heart disease and other diseases of the circulatory system: Secondary | ICD-10-CM | POA: Insufficient documentation

## 2021-05-29 DIAGNOSIS — K635 Polyp of colon: Secondary | ICD-10-CM

## 2021-05-29 DIAGNOSIS — K259 Gastric ulcer, unspecified as acute or chronic, without hemorrhage or perforation: Secondary | ICD-10-CM | POA: Diagnosis not present

## 2021-05-29 DIAGNOSIS — E785 Hyperlipidemia, unspecified: Secondary | ICD-10-CM | POA: Diagnosis not present

## 2021-05-29 DIAGNOSIS — K222 Esophageal obstruction: Secondary | ICD-10-CM | POA: Diagnosis not present

## 2021-05-29 DIAGNOSIS — K5730 Diverticulosis of large intestine without perforation or abscess without bleeding: Secondary | ICD-10-CM | POA: Insufficient documentation

## 2021-05-29 DIAGNOSIS — F17210 Nicotine dependence, cigarettes, uncomplicated: Secondary | ICD-10-CM | POA: Diagnosis not present

## 2021-05-29 DIAGNOSIS — Z79899 Other long term (current) drug therapy: Secondary | ICD-10-CM | POA: Insufficient documentation

## 2021-05-29 DIAGNOSIS — D123 Benign neoplasm of transverse colon: Secondary | ICD-10-CM | POA: Diagnosis not present

## 2021-05-29 DIAGNOSIS — R195 Other fecal abnormalities: Secondary | ICD-10-CM | POA: Insufficient documentation

## 2021-05-29 DIAGNOSIS — R0789 Other chest pain: Secondary | ICD-10-CM | POA: Diagnosis not present

## 2021-05-29 DIAGNOSIS — K449 Diaphragmatic hernia without obstruction or gangrene: Secondary | ICD-10-CM | POA: Insufficient documentation

## 2021-05-29 DIAGNOSIS — Z808 Family history of malignant neoplasm of other organs or systems: Secondary | ICD-10-CM | POA: Diagnosis not present

## 2021-05-29 DIAGNOSIS — Z885 Allergy status to narcotic agent status: Secondary | ICD-10-CM | POA: Diagnosis not present

## 2021-05-29 DIAGNOSIS — Z85828 Personal history of other malignant neoplasm of skin: Secondary | ICD-10-CM | POA: Diagnosis not present

## 2021-05-29 DIAGNOSIS — D124 Benign neoplasm of descending colon: Secondary | ICD-10-CM | POA: Insufficient documentation

## 2021-05-29 DIAGNOSIS — Z803 Family history of malignant neoplasm of breast: Secondary | ICD-10-CM | POA: Diagnosis not present

## 2021-05-29 DIAGNOSIS — I129 Hypertensive chronic kidney disease with stage 1 through stage 4 chronic kidney disease, or unspecified chronic kidney disease: Secondary | ICD-10-CM | POA: Insufficient documentation

## 2021-05-29 DIAGNOSIS — N189 Chronic kidney disease, unspecified: Secondary | ICD-10-CM | POA: Insufficient documentation

## 2021-05-29 DIAGNOSIS — K640 First degree hemorrhoids: Secondary | ICD-10-CM | POA: Insufficient documentation

## 2021-05-29 HISTORY — PX: POLYPECTOMY: SHX5525

## 2021-05-29 HISTORY — PX: ESOPHAGOGASTRODUODENOSCOPY (EGD) WITH PROPOFOL: SHX5813

## 2021-05-29 HISTORY — PX: COLONOSCOPY WITH PROPOFOL: SHX5780

## 2021-05-29 SURGERY — COLONOSCOPY WITH PROPOFOL
Anesthesia: General

## 2021-05-29 MED ORDER — STERILE WATER FOR IRRIGATION IR SOLN
Status: DC | PRN
  Administered 2021-05-29: 300 mL

## 2021-05-29 MED ORDER — LACTATED RINGERS IV SOLN: INTRAVENOUS | Status: DC

## 2021-05-29 MED ORDER — EPHEDRINE SULFATE 50 MG/ML IJ SOLN
INTRAMUSCULAR | Status: DC | PRN
  Administered 2021-05-29: 5 mg via INTRAVENOUS

## 2021-05-29 MED ORDER — LIDOCAINE HCL (CARDIAC) PF 100 MG/5ML IV SOSY
PREFILLED_SYRINGE | INTRAVENOUS | Status: DC | PRN
  Administered 2021-05-29: 100 mg via INTRAVENOUS

## 2021-05-29 MED ORDER — PROPOFOL 10 MG/ML IV BOLUS
INTRAVENOUS | Status: DC | PRN
  Administered 2021-05-29: 125 ug/kg/min via INTRAVENOUS
  Administered 2021-05-29: 100 mg via INTRAVENOUS

## 2021-05-29 NOTE — Op Note (Signed)
[facility] Patient Name: Jeremiah Calhoun Procedure Date: 05/29/2021 10:06 AM MRN: [id number] Date of Birth: 1950-12-05 Attending MD: Norvel Richards , MD CSN: [id number] Age: 70 Admit Type: Outpatient Procedure:                Colonoscopy Indications:              Heme positive stool Providers:                Norvel Richards, MD, Lambert Mody,                            Raphael Gibney, Technician Referring MD:              Medicines:                Propofol per Anesthesia Complications:            No immediate complications. Estimated Blood Loss:     Estimated blood loss was minimal. Procedure:                Pre-Anesthesia Assessment:                           - Prior to the procedure, a History and Physical                            was performed, and patient medications and                            allergies were reviewed. The patient's tolerance of                            previous anesthesia was also reviewed. The risks                            and benefits of the procedure and the sedation                            options and risks were discussed with the patient.                            All questions were answered, and informed consent                            was obtained. Prior Anticoagulants: The patient has                            taken no previous anticoagulant or antiplatelet                            agents. ASA Grade Assessment: II - A patient with                            mild systemic disease. After reviewing the risks  and benefits, the patient was deemed in                            satisfactory condition to undergo the procedure.                           After obtaining informed consent, the colonoscope                            was passed under direct vision. Throughout the                            procedure, the patient's blood pressure, pulse, and                            oxygen saturations  were monitored continuously. The                            [id number] scope was introduced through the                            anus and advanced to the the cecum, identified by                            appendiceal orifice and ileocecal valve. The                            colonoscopy was performed without difficulty. The                            patient tolerated the procedure well. The quality                            of the bowel preparation was adequate. The                            ileocecal valve, appendiceal orifice, and rectum                            were photographed. The entire colon was well                            visualized. Scope In: 10:09:55 AM Scope Out: 10:29:19 AM Scope Withdrawal Time: 0 hours 15 minutes 48 seconds  Total Procedure Duration: 0 hours 19 minutes 24 seconds  Findings:      The perianal and digital rectal examinations were normal.      Four sessile polyps were found in the descending colon and hepatic       flexure. The polyps were 5 to 8 mm in size. These polyps were removed       with a cold snare. Resection and retrieval were complete. Estimated       blood loss was minimal.      Scattered small and large-mouthed diverticula were found in the sigmoid       colon, descending colon  and transverse colon.      Non-bleeding internal hemorrhoids were found during retroflexion. The       hemorrhoids were moderate, medium-sized and Grade I (internal       hemorrhoids that do not prolapse). Impression:               - Four 5 to 8 mm polyps in the descending colon and                            at the hepatic flexure, removed with a cold snare.                            Resected and retrieved.                           - Diverticulosis in the sigmoid colon, in the                            descending colon and in the transverse colon.                           - Non-bleeding internal hemorrhoids. Moderate Sedation:      Moderate  (conscious) sedation was personally administered by an       anesthesia professional. The following parameters were monitored: oxygen       saturation, heart rate, blood pressure, respiratory rate, EKG, adequacy       of pulmonary ventilation, and response to care. Recommendation:           - Patient has a contact number available for                            emergencies. The signs and symptoms of potential                            delayed complications were discussed with the                            patient. Return to normal activities tomorrow.                            Written discharge instructions were provided to the                            patient.                           - Advance diet as tolerated.                           - Repeat colonoscopy date to be determined after                            pending pathology results are reviewed for                            surveillance. Procedure Code(s):        ---  Professional ---                           [phone], Colonoscopy, flexible; with removal of                            tumor(s), polyp(s), or other lesion(s) by snare                            technique Diagnosis Code(s):        --- Professional ---                           K63.5, Polyp of colon                           K64.0, First degree hemorrhoids                           R19.5, Other fecal abnormalities                           K57.30, Diverticulosis of large intestine without                            perforation or abscess without bleeding CPT copyright 2019 American [employer] Association. All rights reserved. The codes documented in this report are preliminary and upon coder review may  be revised to meet current compliance requirements. [name], MD Norvel Richards, MD 05/29/2021 10:40:34 AM This report has been signed electronically. Number of Addenda: 0

## 2021-05-29 NOTE — Op Note (Signed)
[facility] Patient Name: Lawrnce Calhoun Procedure Date: 05/29/2021 9:43 AM MRN: [id number] Date of Birth: 08/12/1951 Attending MD: Norvel Richards , MD CSN: [id number] Age: 70 Admit Type: Outpatient Procedure:                Upper GI endoscopy Indications:              Chest pain (non cardiac) Providers:                Norvel Richards, MD, Lambert Mody,                            Raphael Gibney, Technician Referring MD:              Medicines:                Propofol per Anesthesia Complications:            No immediate complications. Estimated Blood Loss:     Estimated blood loss: none. Procedure:                Pre-Anesthesia Assessment:                           - Prior to the procedure, a History and Physical                            was performed, and patient medications and                            allergies were reviewed. The patient's tolerance of                            previous anesthesia was also reviewed. The risks                            and benefits of the procedure and the sedation                            options and risks were discussed with the patient.                            All questions were answered, and informed consent                            was obtained. Prior Anticoagulants: The patient has                            taken no previous anticoagulant or antiplatelet                            agents. ASA Grade Assessment: II - A patient with                            mild systemic disease. After reviewing the risks  and benefits, the patient was deemed in                            satisfactory condition to undergo the procedure.                           After obtaining informed consent, the endoscope was                            passed under direct vision. Throughout the                            procedure, the patient's blood pressure, pulse, and                            oxygen saturations  were monitored continuously. The                            [id number] scope was introduced through the                            mouth, and advanced to the second part of duodenum.                            The upper GI endoscopy was accomplished without                            difficulty. The patient tolerated the procedure                            well. Scope In: 9:58:35 AM Scope Out: 10:02:56 AM Total Procedure Duration: 0 hours 4 minutes 21 seconds  Findings:      A mild Schatzki ring was found at the gastroesophageal junction. Stomach       empty. Large hiatal hernia present. Good look at the proximal EG       junction/cardia. I did not see a paraesophageal hernia. This appeared to       be a simple sliding hiatal hernia. Patient did have multiple linear       gastric erosions involving the mucosa straddling the diaphragmatic       hiatus consistent with Jeremiah Calhoun lesions. No ulcer or infiltrating process       observed. Patent pylorus. Examination of the bulb and second portion       revealed no abnormalities Impression:               - Mild Schatzki ring. Large hiatal hernia. Gastric                            erosions consistent with Jeremiah Calhoun lesions                           - No specimens collected. Moderate Sedation:      Moderate (conscious) sedation was personally administered by an       anesthesia professional. The following parameters were monitored: oxygen  saturation, heart rate, blood pressure, and response to care. Recommendation:           - Patient has a contact number available for                            emergencies. The signs and symptoms of potential                            delayed complications were discussed with the                            patient. Return to normal activities tomorrow.                            Written discharge instructions were provided to the                            patient.                           -  Advance diet as tolerated. Continue Protonix 40                            mg orally twice daily. See colonoscopy report.                            Office visit with [place] in 3 months Procedure Code(s):        --- Professional ---                           [phone], Esophagogastroduodenoscopy, flexible,                            transoral; diagnostic, including collection of                            specimen(s) by brushing or washing, when performed                            (separate procedure) Diagnosis Code(s):        --- Professional ---                           K22.2, Esophageal obstruction                           R07.89, Other chest pain CPT copyright 2019 American Medical Association. All rights reserved. The codes documented in this report are preliminary and upon coder review may  be revised to meet current compliance requirements. [name]. [name], MD Norvel Richards, MD 05/29/2021 10:45:27 AM This report has been signed electronically. Number of Addenda: 0

## 2021-05-29 NOTE — Transfer of Care (Signed)
Immediate Anesthesia Transfer of Care Note  Patient: Jeremiah Calhoun  Procedure(s) Performed: COLONOSCOPY WITH PROPOFOL ESOPHAGOGASTRODUODENOSCOPY (EGD) WITH PROPOFOL POLYPECTOMY  Patient Location: Endoscopy Unit  Anesthesia Type:General  Level of Consciousness: awake, alert , oriented and patient cooperative  Airway & Oxygen Therapy: Patient Spontanous Breathing  Post-op Assessment: Report given to RN, Post -op Vital signs reviewed and stable and Patient moving all extremities X 4  Post vital signs: Reviewed and stable  Last Vitals:  Vitals Value Taken Time  BP    Temp    Pulse    Resp    SpO2      Last Pain:  Vitals:   05/29/21 0953  TempSrc:   PainSc: 0-No pain      Patients Stated Pain Goal: 8 ([id number])  Complications: No notable events documented.

## 2021-05-29 NOTE — H&P (Signed)
$'@LOGO'v$ @   Primary Care Physician:  [name], MD Primary Gastroenterologist:  Dr.Alyus Mofield   Pre-Procedure History & Physical: HPI:  Jeremiah Calhoun is a 70 y.o. male here for further evaluation of Hemoccult positive stool.  Distant history colonic polyp; overdue for surveillance colonoscopy recent chest pain not felt to be cardiac in etiology.  With a known hiatal hernia with a paraesophageal component.  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  Patient has no GI symptoms currently.  No dysphagia, GERD very well controlled with PPI.  No melena or rectal bleeding.  Past Medical History:  Diagnosis Date   Chest pain, exertional    Chronic kidney disease    kidney stones   Hiatal hernia    Hyperlipidemia    Hypertension    Skin cancer    Splenic infarct     Past Surgical History:  Procedure Laterality Date   COLONOSCOPY N/A 04/19/2014   [id number] hemorrhoids. Colonic diverticulosis. Single colonic polyp removed as described above   ESOPHAGOGASTRODUODENOSCOPY N/A 04/19/2014   [id number] reflux esophagitis - benign-appearing peptic stricture  -  status post dilation as described above. 5 cm hiatal hernia. Gastric erosions.    Status post Maloney dilation and gastric biopsy   HERNIA REPAIR     X2    Prior to Admission medications   Medication Sig Start Date End Date Taking? Authorizing Provider  acetaminophen (TYLENOL) 650 MG CR tablet Take 1,300 mg by mouth every 8 (eight) hours as needed for pain.   Yes [provider]  allopurinol (ZYLOPRIM) 100 MG tablet Take 200 mg by mouth daily. 12/11/20  Yes [provider]  amLODipine (NORVASC) 5 MG tablet Take 5 mg by mouth daily. 12/03/20  Yes [provider]  Ascorbic Acid (VITAMIN C) 1000 MG tablet Take 1,000 mg by mouth daily.   Yes [provider]  aspirin 81 MG tablet Take 81 mg by mouth daily.   Yes [provider]  Cholecalciferol (VITAMIN D3) 125 MCG (5000 UT) CAPS Take 5,000  Units by mouth daily.   Yes [provider]  Coenzyme Q10 (CO Q 10) 100 MG CAPS Take 200 mg by mouth daily.   Yes [provider]  Cyanocobalamin (VITAMIN B-12) 5000 MCG TBDP Take 5,000 mcg by mouth daily.   Yes [provider]  ferrous sulfate 325 (65 FE) MG tablet Take 325 mg by mouth daily with breakfast.   Yes [provider]  leflunomide (ARAVA) 20 MG tablet Take 20 mg by mouth daily. 12/02/20  Yes [provider]  lisinopril-hydrochlorothiazide (PRINZIDE,ZESTORETIC) 20-12.5 MG tablet TAKE 2 TABLETS BY MOUTH ONCE DAILY. Patient taking differently: Take 2 tablets by mouth daily. 07/05/16  Yes Lendon Colonel, NP  metoprolol succinate (TOPROL-XL) 100 MG 24 hr tablet TAKE ONE TABLET BY MOUTH DAILY. TAKE WITH OR IMMEDIATELY FOLLOWING A MEAL. Patient taking differently: Take 100 mg by mouth daily. 05/02/16  Yes Lendon Colonel, NP  Multiple Vitamins-Minerals (MULTIVITAMIN WITH MINERALS) tablet Take 1 tablet by mouth daily. Kidney essential   Yes [provider]  Omega-3 Fatty Acids (FISH OIL PO) Take 1,400 mg by mouth daily.   Yes [provider]  OVER THE COUNTER MEDICATION Take 2 tablets by mouth daily. Super Bate Prostate   Yes [provider]  pantoprazole (PROTONIX) 40 MG tablet Take 1 tablet (40 mg total) by mouth 2 (two) times daily. 04/20/14  Yes McInnis, Angus, MD  polyethylene glycol-electrolytes (NULYTELY) 420 g solution As  directed 03/20/21  Yes Bethanne Mule, [name], MD  rosuvastatin (CRESTOR) 10 MG tablet Take 10 mg by mouth at bedtime.   Yes [provider]  spironolactone (ALDACTONE) 25 MG tablet TAKE ONE TABLET BY MOUTH DAILY. Patient taking differently: Take 25 mg by mouth daily. 04/25/16  Yes Herminio Commons, MD  tiZANidine (ZANAFLEX) 2 MG tablet Take 2 mg by mouth every 8 (eight) hours as needed for muscle spasms. 10/04/20  Yes [provider]  zinc gluconate 50 MG tablet Take 50 mg by mouth  daily.   Yes [provider]  polyethylene glycol-electrolytes (TRILYTE) 420 g solution Take 4,000 mLs by mouth as directed. 01/26/21   Davey Bergsma, [name], MD  sulfamethoxazole-trimethoprim (BACTRIM DS) 800-160 MG tablet Take 1 tablet by mouth daily as needed (Breakout on rear). 01/24/21   [provider]    Allergies as of 03/20/2021 - Review Complete 02/22/2021  Allergen Reaction Noted   Codeine Nausea And Vomiting 06/25/2012    Family History  Problem Relation Age of Onset   Heart attack Mother 32       Cause of death   Heart attack Father 18       Cause of death   Hypertension Sister    Breast cancer Sister    Skin cancer Paternal Aunt    Colon cancer Neg Hx     Social History   Socioeconomic History   Marital status: Divorced    Spouse name: Not on file   Number of children: Not on file   Years of education: Not on file   Highest education level: Not on file  Occupational History   Not on file  Tobacco Use   Smoking status: Every Day    Packs/day: 0.80    Years: 15.00    Pack years: 12.00    Types: Cigarettes    Last attempt to quit: 10/01/2013    Years since quitting: 7.6   Smokeless tobacco: Never  Vaping Use   Vaping Use: Never used  Substance and Sexual Activity   Alcohol use: Yes    Alcohol/week: 0.0 standard drinks    Comment: Rare   Drug use: No   Sexual activity: Not on file  Other Topics Concern   Not on file  Social History Narrative   Not on file   Social Determinants of Health   Financial Resource Strain: Not on file  Food Insecurity: Not on file  Transportation Needs: Not on file  Physical Activity: Not on file  Stress: Not on file  Social Connections: Not on file  Intimate Partner Violence: Not At Risk   Fear of Current or Ex-Partner: No   Emotionally Abused: No   Physically Abused: No   Sexually Abused: No    Review of Systems: See HPI, otherwise negative ROS  Physical Exam: BP 113/72   Pulse 81   Temp 98.1 F  (36.7 C) (Oral)   Resp (!) 22   Ht '5\' 9"'$  (1.753 m)   Wt 83.9 kg   SpO2 96%   BMI 27.32 kg/m  General:   Alert,  Well-developed, well-nourished, pleasant and cooperative in NAD Lungs:  Clear throughout to auscultation.   No wheezes, crackles, or rhonchi. No acute distress. Heart:  Regular rate and rhythm; no murmurs, clicks, rubs,  or gallops. Abdomen: Non-distended, normal bowel sounds.  Soft and nontender without appreciable mass or hepatosplenomegaly.  Pulses:  Normal pulses noted. Extremities:  Without clubbing or edema.  Impression/Plan: 70 year old gentleman with long  history of GERD history colonic polyp found Hemoccult positive earlier in the year.  Emergency room visit for atypical chest pain not felt to be cardiac in etiology.  Has not had any episodes since that time he is actually devoid of any GI tract symptoms currently.  Per plan, I have offered him a diagnostic EGD followed by colonoscopy per plan.  The risks, benefits, limitations, imponderables and alternatives regarding both EGD and colonoscopy have been reviewed with the patient. Questions have been answered. All parties agreeable.      Notice: This dictation was prepared with Dragon dictation along with smaller phrase technology. Any transcriptional errors that result from this process are unintentional and may not be corrected upon review.

## 2021-05-29 NOTE — Anesthesia Postprocedure Evaluation (Signed)
Anesthesia Post Note  Patient: Jeremiah Calhoun  Procedure(s) Performed: COLONOSCOPY WITH PROPOFOL ESOPHAGOGASTRODUODENOSCOPY (EGD) WITH PROPOFOL POLYPECTOMY  Patient location during evaluation: Endoscopy Anesthesia Type: General Level of consciousness: awake and alert and oriented Pain management: pain level controlled Vital Signs Assessment: post-procedure vital signs reviewed and stable Respiratory status: spontaneous breathing and respiratory function stable Cardiovascular status: blood pressure returned to baseline and stable Postop Assessment: no apparent nausea or vomiting Anesthetic complications: no   No notable events documented.   Last Vitals:  Vitals:   05/29/21 0852 05/29/21 1033  BP: 113/72 103/63  Pulse: 81 84  Resp: (!) 22 17  Temp: 36.7 C 36.6 C  SpO2: 96% 96%    Last Pain:  Vitals:   05/29/21 1033  TempSrc: Oral  PainSc: 0-No pain                 Marigene Erler C Shamir Tuzzolino

## 2021-05-29 NOTE — Discharge Instructions (Signed)
Colonoscopy Discharge Instructions  Read the instructions outlined below and refer to this sheet in the next few weeks. These discharge instructions provide you with general information on caring for yourself after you leave the hospital. Your doctor may also give you specific instructions. While your treatment has been planned according to the most current medical practices available, unavoidable complications occasionally occur. If you have any problems or questions after discharge, call Dr. [name] at [phone]. ACTIVITY You may resume your regular activity, but move at a slower pace for the next 24 hours.  Take frequent rest periods for the next 24 hours.  Walking will help get rid of the air and reduce the bloated feeling in your belly (abdomen).  No driving for 24 hours (because of the medicine (anesthesia) used during the test).   Do not sign any important legal documents or operate any machinery for 24 hours (because of the anesthesia used during the test).  NUTRITION Drink plenty of fluids.  You may resume your normal diet as instructed by your doctor.  Begin with a light meal and progress to your normal diet. Heavy or fried foods are harder to digest and may make you feel sick to your stomach (nauseated).  Avoid alcoholic beverages for 24 hours or as instructed.  MEDICATIONS You may resume your normal medications unless your doctor tells you otherwise.  WHAT YOU CAN EXPECT TODAY Some feelings of bloating in the abdomen.  Passage of more gas than usual.  Spotting of blood in your stool or on the toilet paper.  IF YOU HAD POLYPS REMOVED DURING THE COLONOSCOPY: No aspirin products for 7 days or as instructed.  No alcohol for 7 days or as instructed.  Eat a soft diet for the next 24 hours.  FINDING OUT THE RESULTS OF YOUR TEST Not all test results are available during your visit. If your test results are not back during the visit, make an appointment with your caregiver to find out the  results. Do not assume everything is normal if you have not heard from your caregiver or the medical facility. It is important for you to follow up on all of your test results.  SEEK IMMEDIATE MEDICAL ATTENTION IF: You have more than a spotting of blood in your stool.  Your belly is swollen (abdominal distention).  You are nauseated or vomiting.  You have a temperature over 101.  You have abdominal pain or discomfort that is severe or gets worse throughout the day.   EGD Discharge instructions Please read the instructions outlined below and refer to this sheet in the next few weeks. These discharge instructions provide you with general information on caring for yourself after you leave the hospital. Your doctor may also give you specific instructions. While your treatment has been planned according to the most current medical practices available, unavoidable complications occasionally occur. If you have any problems or questions after discharge, please call your doctor. ACTIVITY You may resume your regular activity but move at a slower pace for the next 24 hours.  Take frequent rest periods for the next 24 hours.  Walking will help expel (get rid of) the air and reduce the bloated feeling in your abdomen.  No driving for 24 hours (because of the anesthesia (medicine) used during the test).  You may shower.  Do not sign any important legal documents or operate any machinery for 24 hours (because of the anesthesia used during the test).  NUTRITION Drink plenty of fluids.  You may  resume your normal diet.  Begin with a light meal and progress to your normal diet.  Avoid alcoholic beverages for 24 hours or as instructed by your caregiver.  MEDICATIONS You may resume your normal medications unless your caregiver tells you otherwise.  WHAT YOU CAN EXPECT TODAY You may experience abdominal discomfort such as a feeling of fullness or "gas" pains.  FOLLOW-UP Your doctor will discuss the results of  your test with you.  SEEK IMMEDIATE MEDICAL ATTENTION IF ANY OF THE FOLLOWING OCCUR: Excessive nausea (feeling sick to your stomach) and/or vomiting.  Severe abdominal pain and distention (swelling).  Trouble swallowing.  Temperature over 101 F (37.8 C).  Rectal bleeding or vomiting of blood.  Multiple polyps removed in your colon today  You have irritation in your stomach from a large hiatal hernia  Continue Protonix twice daily.  Further recommendations to follow pending pathology report  At patient request, I called Freda Munro at [phone] -got answering service.  Left a message.

## 2021-05-29 NOTE — Anesthesia Preprocedure Evaluation (Addendum)
Anesthesia Evaluation  Patient identified by MRN, date of birth, ID band Patient awake    Reviewed: Allergy & Precautions, NPO status , Patient's Chart, lab work & pertinent test results, reviewed documented beta blocker date and time   History of Anesthesia Complications Negative for: history of anesthetic complications  Airway Mallampati: III  TM Distance: >3 FB Neck ROM: Full    Dental  (+) Dental Advisory Given, Caps   Pulmonary Current SmokerPatient did not abstain from smoking.,    Pulmonary exam normal breath sounds clear to auscultation       Cardiovascular Exercise Tolerance: Good hypertension, Pt. on medications and Pt. on home beta blockers Normal cardiovascular exam Rhythm:Regular Rate:Normal     Neuro/Psych negative neurological ROS  negative psych ROS   GI/Hepatic Neg liver ROS, hiatal hernia, GERD  Medicated and Controlled,  Endo/Other  negative endocrine ROS  Renal/GU Renal InsufficiencyRenal disease     Musculoskeletal negative musculoskeletal ROS (+)   Abdominal   Peds  Hematology  (+) anemia ,   Anesthesia Other Findings   Reproductive/Obstetrics negative OB ROS                            Anesthesia Physical Anesthesia Plan  ASA: 2  Anesthesia Plan: General   Post-op Pain Management:    Induction:   PONV Risk Score and Plan: TIVA  Airway Management Planned: Nasal Cannula and Natural Airway  Additional Equipment:   Intra-op Plan:   Post-operative Plan:   Informed Consent: I have reviewed the patients History and Physical, chart, labs and discussed the procedure including the risks, benefits and alternatives for the proposed anesthesia with the patient or authorized representative who has indicated his/her understanding and acceptance.     Dental advisory given  Plan Discussed with: CRNA and Surgeon  Anesthesia Plan Comments:        Anesthesia  Quick Evaluation

## 2021-05-30 LAB — SURGICAL PATHOLOGY

## 2021-05-31 ENCOUNTER — Encounter: Payer: Self-pay | Admitting: Internal Medicine

## 2021-06-06 ENCOUNTER — Encounter (HOSPITAL_COMMUNITY): Payer: Self-pay | Admitting: Internal Medicine

## 2021-07-28 ENCOUNTER — Inpatient Hospital Stay (HOSPITAL_COMMUNITY): Payer: Medicare HMO

## 2021-07-31 ENCOUNTER — Ambulatory Visit (HOSPITAL_COMMUNITY): Payer: Medicare HMO | Admitting: Physician Assistant

## 2021-08-14 ENCOUNTER — Inpatient Hospital Stay (HOSPITAL_COMMUNITY): Payer: Medicare HMO | Attending: Hematology

## 2021-08-14 ENCOUNTER — Other Ambulatory Visit: Payer: Self-pay

## 2021-08-14 DIAGNOSIS — D500 Iron deficiency anemia secondary to blood loss (chronic): Secondary | ICD-10-CM | POA: Insufficient documentation

## 2021-08-14 DIAGNOSIS — Z87442 Personal history of urinary calculi: Secondary | ICD-10-CM | POA: Diagnosis not present

## 2021-08-14 DIAGNOSIS — Z7982 Long term (current) use of aspirin: Secondary | ICD-10-CM | POA: Diagnosis not present

## 2021-08-14 DIAGNOSIS — D735 Infarction of spleen: Secondary | ICD-10-CM | POA: Insufficient documentation

## 2021-08-14 DIAGNOSIS — K449 Diaphragmatic hernia without obstruction or gangrene: Secondary | ICD-10-CM | POA: Diagnosis not present

## 2021-08-14 DIAGNOSIS — Z803 Family history of malignant neoplasm of breast: Secondary | ICD-10-CM | POA: Diagnosis not present

## 2021-08-14 DIAGNOSIS — N189 Chronic kidney disease, unspecified: Secondary | ICD-10-CM | POA: Diagnosis not present

## 2021-08-14 DIAGNOSIS — Z85828 Personal history of other malignant neoplasm of skin: Secondary | ICD-10-CM | POA: Diagnosis not present

## 2021-08-14 DIAGNOSIS — I129 Hypertensive chronic kidney disease with stage 1 through stage 4 chronic kidney disease, or unspecified chronic kidney disease: Secondary | ICD-10-CM | POA: Insufficient documentation

## 2021-08-14 DIAGNOSIS — F17210 Nicotine dependence, cigarettes, uncomplicated: Secondary | ICD-10-CM | POA: Insufficient documentation

## 2021-08-14 DIAGNOSIS — D509 Iron deficiency anemia, unspecified: Secondary | ICD-10-CM

## 2021-08-14 DIAGNOSIS — Z79899 Other long term (current) drug therapy: Secondary | ICD-10-CM | POA: Diagnosis not present

## 2021-08-14 DIAGNOSIS — Z800 Family history of malignant neoplasm of digestive organs: Secondary | ICD-10-CM | POA: Insufficient documentation

## 2021-08-14 LAB — CBC WITH DIFFERENTIAL/PLATELET
Abs Immature Granulocytes: 0.01 10*3/uL (ref 0.00–0.07)
Basophils Absolute: 0.1 10*3/uL (ref 0.0–0.1)
Basophils Relative: 1 %
Eosinophils Absolute: 0.4 10*3/uL (ref 0.0–0.5)
Eosinophils Relative: 8 %
HCT: 46.2 % (ref 39.0–52.0)
Hemoglobin: 14.9 g/dL (ref 13.0–17.0)
Immature Granulocytes: 0 %
Lymphocytes Relative: 21 %
Lymphs Abs: 1.1 10*3/uL (ref 0.7–4.0)
MCH: 31 pg (ref 26.0–34.0)
MCHC: 32.3 g/dL (ref 30.0–36.0)
MCV: 96 fL (ref 80.0–100.0)
Monocytes Absolute: 0.5 10*3/uL (ref 0.1–1.0)
Monocytes Relative: 11 %
Neutro Abs: 3 10*3/uL (ref 1.7–7.7)
Neutrophils Relative %: 59 %
Platelets: 156 10*3/uL (ref 150–400)
RBC: 4.81 MIL/uL (ref 4.22–5.81)
RDW: 14 % (ref 11.5–15.5)
WBC: 5 10*3/uL (ref 4.0–10.5)
nRBC: 0 % (ref 0.0–0.2)

## 2021-08-14 LAB — COMPREHENSIVE METABOLIC PANEL
ALT: 33 U/L (ref 0–44)
AST: 28 U/L (ref 15–41)
Albumin: 4.1 g/dL (ref 3.5–5.0)
Alkaline Phosphatase: 109 U/L (ref 38–126)
Anion gap: 10 (ref 5–15)
BUN: 29 mg/dL — ABNORMAL HIGH (ref 8–23)
CO2: 19 mmol/L — ABNORMAL LOW (ref 22–32)
Calcium: 9.1 mg/dL (ref 8.9–10.3)
Chloride: 108 mmol/L (ref 98–111)
Creatinine, Ser: 1.66 mg/dL — ABNORMAL HIGH (ref 0.61–1.24)
GFR, Estimated: 44 mL/min — ABNORMAL LOW (ref >60)
Glucose, Bld: 107 mg/dL — ABNORMAL HIGH (ref 70–99)
Potassium: 4.2 mmol/L (ref 3.5–5.1)
Sodium: 137 mmol/L (ref 135–145)
Total Bilirubin: 0.6 mg/dL (ref 0.3–1.2)
Total Protein: 7.2 g/dL (ref 6.5–8.1)

## 2021-08-15 NOTE — Progress Notes (Signed)
[place], [place] [id number]   CLINIC:  Medical Oncology/Hematology  PCP:  [name], MD [place] [address] [phone]   REASON FOR VISIT:  Follow-up for splenic infarct  CURRENT THERAPY: Observation  INTERVAL HISTORY:  Mr. Petropoulos 70 y.o. male returns for routine follow-up of splenic infarcts.  He was last seen by Dr. Chryl Heck on 01/27/2021.  At today's visit, he reports feeling fairly well.  No recent hospitalizations, surgeries, or changes in baseline health status.  He has not had any further issues with abdominal pain. No signs or symptoms of DVT or PE. No B symptoms such as fever, chills, night sweats, unintentional weight loss. No changes in his breathing, bowel, or urinary habits. No bleeding episodes such as hematemesis, hematochezia, melena, or epistaxis. He denies any fatigue, chest pain, dyspnea on exertion, lightheadedness, or syncope. He continues to follow-up with gastroenterology for his history of hiatal hernia, gastritis, and bleeding Cameron lesions. He continues to take daily ferrous sulfate supplementation.  He has 100% energy and 100% appetite. He endorses that he is maintaining a stable weight.   REVIEW OF SYSTEMS:  Review of Systems  Constitutional:  Negative for appetite change, chills, diaphoresis, fatigue, fever and unexpected weight change.  HENT:   Negative for lump/mass and nosebleeds.   Eyes:  Negative for eye problems.  Respiratory:  Negative for cough, hemoptysis and shortness of breath.   Cardiovascular:  Negative for chest pain, leg swelling and palpitations.  Gastrointestinal:  Negative for abdominal pain, blood in stool, constipation, diarrhea, nausea and vomiting.  Genitourinary:  Negative for hematuria.   Musculoskeletal:  Positive for arthralgias.  Skin: Negative.   Neurological:  Negative for dizziness, headaches and light-headedness.  Hematological:  Does not  bruise/bleed easily.     PAST MEDICAL/SURGICAL HISTORY:  Past Medical History:  Diagnosis Date   Chest pain, exertional    Chronic kidney disease    kidney stones   Hiatal hernia    Hyperlipidemia    Hypertension    Skin cancer    Splenic infarct    Past Surgical History:  Procedure Laterality Date   COLONOSCOPY N/A 04/19/2014   YOV:ZCHYIFOY hemorrhoids. Colonic diverticulosis. Single colonic polyp removed as described above   COLONOSCOPY WITH PROPOFOL N/A 05/29/2021   Procedure: COLONOSCOPY WITH PROPOFOL;  Surgeon: Daneil Dolin, MD;  [facility];  Service: Endoscopy;  Laterality: N/A;  10:00am   ESOPHAGOGASTRODUODENOSCOPY N/A 04/19/2014   DXA:JOINOMV reflux esophagitis - benign-appearing peptic stricture  -  status post dilation as described above. 5 cm hiatal hernia. Gastric erosions.    Status post Venia Minks dilation and gastric biopsy   ESOPHAGOGASTRODUODENOSCOPY (EGD) WITH PROPOFOL N/A 05/29/2021   Procedure: ESOPHAGOGASTRODUODENOSCOPY (EGD) WITH PROPOFOL;  Surgeon: Daneil Dolin, MD;  Location: [facility];  Service: Endoscopy;  Laterality: N/A;   HERNIA REPAIR     X2   POLYPECTOMY  05/29/2021   Procedure: POLYPECTOMY;  Surgeon: Daneil Dolin, MD;  Location: [facility];  Service: Endoscopy;;     SOCIAL HISTORY:  Social History   Socioeconomic History   Marital status: Divorced    Spouse name: Not on file   Number of children: Not on file   Years of education: Not on file   Highest education level: Not on file  Occupational History   Not on file  Tobacco Use   Smoking status: Every Day    Packs/day: 0.80  Years: 15.00    Pack years: 12.00    Types: Cigarettes    Last attempt to quit: 10/01/2013    Years since quitting: 7.8   Smokeless tobacco: Never  Vaping Use   Vaping Use: Never used  Substance and Sexual Activity   Alcohol use: Yes    Alcohol/week: 0.0 standard drinks    Comment: Rare   Drug use: No   Sexual activity: Not on file   Other Topics Concern   Not on file  Social History Narrative   Not on file   Social Determinants of Health   Financial Resource Strain: Not on file  Food Insecurity: Not on file  Transportation Needs: Not on file  Physical Activity: Not on file  Stress: Not on file  Social Connections: Not on file  Intimate Partner Violence: Not At Risk   Fear of Current or Ex-Partner: No   Emotionally Abused: No   Physically Abused: No   Sexually Abused: No    FAMILY HISTORY:  Family History  Problem Relation Age of Onset   Heart attack Mother 84       Cause of death   Heart attack Father 69       Cause of death   Hypertension Sister    Breast cancer Sister    Skin cancer Paternal Aunt    Colon cancer Neg Hx     CURRENT MEDICATIONS:  Outpatient Encounter Medications as of 08/16/2021  Medication Sig   acetaminophen (TYLENOL) 650 MG CR tablet Take 1,300 mg by mouth every 8 (eight) hours as needed for pain.   allopurinol (ZYLOPRIM) 100 MG tablet Take 200 mg by mouth daily.   amLODipine (NORVASC) 5 MG tablet Take 5 mg by mouth daily.   Ascorbic Acid (VITAMIN C) 1000 MG tablet Take 1,000 mg by mouth daily.   aspirin 81 MG tablet Take 81 mg by mouth daily.   Cholecalciferol (VITAMIN D3) 125 MCG (5000 UT) CAPS Take 5,000 Units by mouth daily.   Coenzyme Q10 (CO Q 10) 100 MG CAPS Take 200 mg by mouth daily.   Cyanocobalamin (VITAMIN B-12) 5000 MCG TBDP Take 5,000 mcg by mouth daily.   ferrous sulfate 325 (65 FE) MG tablet Take 325 mg by mouth daily with breakfast.   leflunomide (ARAVA) 20 MG tablet Take 20 mg by mouth daily.   lisinopril-hydrochlorothiazide (PRINZIDE,ZESTORETIC) 20-12.5 MG tablet TAKE 2 TABLETS BY MOUTH ONCE DAILY. (Patient taking differently: Take 2 tablets by mouth daily.)   metoprolol succinate (TOPROL-XL) 100 MG 24 hr tablet TAKE ONE TABLET BY MOUTH DAILY. TAKE WITH OR IMMEDIATELY FOLLOWING A MEAL. (Patient taking differently: Take 100 mg by mouth daily.)   Multiple  Vitamins-Minerals (MULTIVITAMIN WITH MINERALS) tablet Take 1 tablet by mouth daily. Kidney essential   Omega-3 Fatty Acids (FISH OIL PO) Take 1,400 mg by mouth daily.   OVER THE COUNTER MEDICATION Take 2 tablets by mouth daily. Super Para Skeans Prostate   pantoprazole (PROTONIX) 40 MG tablet Take 1 tablet (40 mg total) by mouth 2 (two) times daily.   polyethylene glycol-electrolytes (NULYTELY) 420 g solution As directed   polyethylene glycol-electrolytes (TRILYTE) 420 g solution Take 4,000 mLs by mouth as directed.   rosuvastatin (CRESTOR) 10 MG tablet Take 10 mg by mouth at bedtime.   spironolactone (ALDACTONE) 25 MG tablet TAKE ONE TABLET BY MOUTH DAILY. (Patient taking differently: Take 25 mg by mouth daily.)   sulfamethoxazole-trimethoprim (BACTRIM DS) 800-160 MG tablet Take 1 tablet by mouth daily as needed (Breakout  on rear).   tiZANidine (ZANAFLEX) 2 MG tablet Take 2 mg by mouth every 8 (eight) hours as needed for muscle spasms.   zinc gluconate 50 MG tablet Take 50 mg by mouth daily.   No facility-administered encounter medications on file as of 08/16/2021.    ALLERGIES:  Allergies  Allergen Reactions   Codeine Nausea And Vomiting     PHYSICAL EXAM:  ECOG PERFORMANCE STATUS: 0 - Asymptomatic  There were no vitals filed for this visit. There were no vitals filed for this visit. Physical Exam Constitutional:      Appearance: Normal appearance. He is obese.  HENT:     Head: Normocephalic and atraumatic.     Mouth/Throat:     Mouth: Mucous membranes are moist.  Eyes:     Extraocular Movements: Extraocular movements intact.     Pupils: Pupils are equal, round, and reactive to light.  Cardiovascular:     Rate and Rhythm: Normal rate and regular rhythm.     Pulses: Normal pulses.     Heart sounds: Normal heart sounds.  Pulmonary:     Effort: Pulmonary effort is normal.     Breath sounds: Normal breath sounds.  Abdominal:     General: Bowel sounds are normal.     Palpations:  Abdomen is soft.     Tenderness: There is no abdominal tenderness.  Musculoskeletal:        General: No swelling.     Right lower leg: No edema.     Left lower leg: No edema.  Lymphadenopathy:     Cervical: No cervical adenopathy.  Skin:    General: Skin is warm and dry.  Neurological:     General: No focal deficit present.     Mental Status: He is alert and oriented to person, place, and time.  Psychiatric:        Mood and Affect: Mood normal.        Behavior: Behavior normal.     LABORATORY DATA:  I have reviewed the labs as listed.  CBC    Component Value Date/Time   WBC 5.0 08/14/2021 1033   RBC 4.81 08/14/2021 1033   HGB 14.9 08/14/2021 1033   HCT 46.2 08/14/2021 1033   PLT 156 08/14/2021 1033   MCV 96.0 08/14/2021 1033   MCH 31.0 08/14/2021 1033   MCHC 32.3 08/14/2021 1033   RDW 14.0 08/14/2021 1033   LYMPHSABS 1.1 08/14/2021 1033   MONOABS 0.5 08/14/2021 1033   EOSABS 0.4 08/14/2021 1033   BASOSABS 0.1 08/14/2021 1033   CMP Latest Ref Rng & Units 08/14/2021 05/26/2021 03/01/2021  Glucose 70 - 99 mg/dL 107(H) 126(H) 112(H)  BUN 8 - 23 mg/dL 29(H) 35(H) 35(H)  Creatinine 0.61 - 1.24 mg/dL 1.66(H) 1.80(H) 1.74(H)  Sodium 135 - 145 mmol/L 137 135 135  Potassium 3.5 - 5.1 mmol/L 4.2 3.9 4.3  Chloride 98 - 111 mmol/L 108 104 105  CO2 22 - 32 mmol/L 19(L) 25 23  Calcium 8.9 - 10.3 mg/dL 9.1 9.1 9.3  Total Protein 6.5 - 8.1 g/dL 7.2 - -  Total Bilirubin 0.3 - 1.2 mg/dL 0.6 - -  Alkaline Phos 38 - 126 U/L 109 - -  AST 15 - 41 U/L 28 - -  ALT 0 - 44 U/L 33 - -    DIAGNOSTIC IMAGING:  I have independently reviewed the relevant imaging and discussed with the patient.  ASSESSMENT & PLAN: 1.  Cryptogenic splenic infarct - Presented to ED on 12/19/2020  with severe abdominal pain - CT abdomen/pelvis (12/19/2020): Splenic infarct along anterior spleen with other areas of low-attenuation potentially small infarcts as well; no perisplenic stranding - No clear reason for  splenic infarct such as abdominal trauma, history of splenic infection, history of blood clots, or family history of hypercoagulable disorders.  No known history of sickle cell disease. - Hypercoagulable work-up showed no evidence of coagulopathy (PNH profile negative, protein S activity normal, protein C activity normal, Antithrombin III normal, cardiolipin antibodies negative, beta-2 glycoprotein and bodies negative, lupus anticoagulant panel negative, factor V Leiden negative, - Per note by Dr. Chryl Heck (01/27/2021), it is unclear if splenic infarct is incidental finding versus actual cause of patient's abdominal pain - Since patient had no other episodes, did not have any continued abdominal pain, or evidence of coagulopathy, Dr. Chryl Heck did not recommend any anticoagulation, but recommended continued follow-up with GI team - PLAN: Patient does not have strong indication for anticoagulation at that this time, especially since he has a history of GI bleeding.  However, if he has any recurrent splenic infarcts or new DVT/PE in the future, he would likely benefit from lifelong anticoagulation at that time. - Continue aspirin 81 mg daily - We will repeat CBC and D-dimer with follow-up visit in 1 year.  If no issues at that time, would consider discharge from clinic.  2.  History of iron deficiency anemia from GI bleeding - History of GI bleed in 2015, required 4 units PRBC transfusions during that time - Most recent EGD/colonoscopy (05/29/2021) revealed large hiatal hernia and gastric erosions consistent with [name] lesions; polyps, diverticulosis, and nonbleeding internal hemorrhoids and colon - No obvious source of bleeding such as hematemesis, hematochezia, or melena - Most recent CBC (08/14/2021): Hgb 14.9/MCV 96.0.  Most recent iron was checked in April 2022, which showed ferritin 30 and iron saturation 37%. - PLAN: Continue daily iron supplement.  Continue follow-up with GI.  We will recheck iron panel  and CBC at follow-up visit in 1 year.  3.  Tobacco use - Smokes 0.5 - 0.75 PPD cigarettes x 15 years - PLAN: Patient does not currently meet criteria for LDCT lung cancer screening program.,  But we have counseled on the importance of smoking cessation.  4.  Other history - Tobacco use as above.  No alcohol intake. - History of GI bleed, hypertension, questionable silent MI   PLAN SUMMARY & DISPOSITION: Labs in 1 year Office visit after labs  All questions were answered. The patient knows to call the clinic with any problems, questions or concerns.  Medical decision making: Low  Time spent on visit: I spent 15 minutes counseling the patient face to face. The total time spent in the appointment was 20 minutes and more than 50% was on counseling.   [name], PA-C  08/16/2021 10:18 AM

## 2021-08-16 ENCOUNTER — Inpatient Hospital Stay (HOSPITAL_COMMUNITY): Payer: Medicare HMO | Admitting: Physician Assistant

## 2021-08-16 ENCOUNTER — Other Ambulatory Visit: Payer: Self-pay

## 2021-08-16 VITALS — BP 127/83 | HR 83 | Temp 97.7°F | Resp 16 | Wt 203.0 lb

## 2021-08-16 DIAGNOSIS — D509 Iron deficiency anemia, unspecified: Secondary | ICD-10-CM | POA: Diagnosis not present

## 2021-08-16 DIAGNOSIS — D735 Infarction of spleen: Secondary | ICD-10-CM | POA: Diagnosis not present

## 2021-08-16 NOTE — Patient Instructions (Signed)
[place] at [facility] Discharge Instructions  You were seen today by Tarri Abernethy PA-C for your history of splenic infarct, which may or may not have been caused by a blood clot at some point in the past.  We do not believe that you need to be on any stronger blood thinners at this time, since your blood did not show any blood clotting disorders and you have not had any previous episodes of abnormal blood clots.  However, if you have any issues with blood clots in the future (such as blood clots in your legs or lungs, or any new issues with your spleen), we may put you on a blood thinner in the future.  However, this would need to be carefully weighed with the fact that you have a history of bleeding from your stomach/intestines.  LABS: Return in 1 year for repeat labs  OTHER TESTS: No other tests at this time, but continue to follow-up with GI doctors.  MEDICATIONS: No changes to home medications.  Continue to take aspirin and iron pill as prescribed.  FOLLOW-UP APPOINTMENT: Office visit in 1 year, after labs   Thank you for choosing [place] at [facility] to provide your oncology and hematology care.  To afford each patient quality time with our provider, please arrive at least 15 minutes before your scheduled appointment time.   If you have a lab appointment with the [facility] please come in thru the Main Entrance and check in at the main information desk.  You need to re-schedule your appointment should you arrive 10 or more minutes late.  We strive to give you quality time with our providers, and arriving late affects you and other patients whose appointments are after yours.  Also, if you no show three or more times for appointments you may be dismissed from the clinic at the providers discretion.     Again, thank you for choosing [facility].  Our hope is that these requests will decrease the amount of time that  you wait before being seen by our physicians.       _____________________________________________________________  Should you have questions after your visit to [facility], please contact our office at [phone] and follow the prompts.  Our office hours are 8:00 a.m. and 4:30 p.m. Monday - Friday.  Please note that voicemails left after 4:00 p.m. may not be returned until the following business day.  We are closed weekends and major holidays.  You do have access to a nurse 24-7, just call the main number to the clinic [phone] and do not press any options, hold on the line and a nurse will answer the phone.    For prescription refill requests, have your pharmacy contact our office and allow 72 hours.    Due to Covid, you will need to wear a mask upon entering the hospital. If you do not have a mask, a mask will be given to you at the Main Entrance upon arrival. For doctor visits, patients may have 1 support person age 8 or older with them. For treatment visits, patients can not have anyone with them due to social distancing guidelines and our immunocompromised population.

## 2021-09-26 ENCOUNTER — Encounter: Payer: Self-pay | Admitting: Internal Medicine

## 2022-08-09 ENCOUNTER — Inpatient Hospital Stay: Payer: Medicare HMO | Attending: Physician Assistant

## 2022-08-09 DIAGNOSIS — Z803 Family history of malignant neoplasm of breast: Secondary | ICD-10-CM | POA: Diagnosis not present

## 2022-08-09 DIAGNOSIS — D509 Iron deficiency anemia, unspecified: Secondary | ICD-10-CM | POA: Insufficient documentation

## 2022-08-09 DIAGNOSIS — D72819 Decreased white blood cell count, unspecified: Secondary | ICD-10-CM | POA: Diagnosis not present

## 2022-08-09 DIAGNOSIS — D696 Thrombocytopenia, unspecified: Secondary | ICD-10-CM | POA: Diagnosis not present

## 2022-08-09 DIAGNOSIS — Z87442 Personal history of urinary calculi: Secondary | ICD-10-CM | POA: Insufficient documentation

## 2022-08-09 DIAGNOSIS — E785 Hyperlipidemia, unspecified: Secondary | ICD-10-CM | POA: Diagnosis not present

## 2022-08-09 DIAGNOSIS — F17210 Nicotine dependence, cigarettes, uncomplicated: Secondary | ICD-10-CM | POA: Diagnosis not present

## 2022-08-09 DIAGNOSIS — K449 Diaphragmatic hernia without obstruction or gangrene: Secondary | ICD-10-CM | POA: Diagnosis not present

## 2022-08-09 DIAGNOSIS — I129 Hypertensive chronic kidney disease with stage 1 through stage 4 chronic kidney disease, or unspecified chronic kidney disease: Secondary | ICD-10-CM | POA: Insufficient documentation

## 2022-08-09 DIAGNOSIS — Z7982 Long term (current) use of aspirin: Secondary | ICD-10-CM | POA: Insufficient documentation

## 2022-08-09 DIAGNOSIS — Z79899 Other long term (current) drug therapy: Secondary | ICD-10-CM | POA: Diagnosis not present

## 2022-08-09 DIAGNOSIS — Z85828 Personal history of other malignant neoplasm of skin: Secondary | ICD-10-CM | POA: Diagnosis not present

## 2022-08-09 DIAGNOSIS — Z7969 Long term (current) use of other immunomodulators and immunosuppressants: Secondary | ICD-10-CM | POA: Diagnosis not present

## 2022-08-09 DIAGNOSIS — D735 Infarction of spleen: Secondary | ICD-10-CM | POA: Diagnosis not present

## 2022-08-09 DIAGNOSIS — N189 Chronic kidney disease, unspecified: Secondary | ICD-10-CM | POA: Insufficient documentation

## 2022-08-09 LAB — CBC WITH DIFFERENTIAL/PLATELET
Abs Immature Granulocytes: 0.02 10*3/uL (ref 0.00–0.07)
Basophils Absolute: 0 10*3/uL (ref 0.0–0.1)
Basophils Relative: 1 %
Eosinophils Absolute: 0.1 10*3/uL (ref 0.0–0.5)
Eosinophils Relative: 3 %
HCT: 39.9 % (ref 39.0–52.0)
Hemoglobin: 13 g/dL (ref 13.0–17.0)
Immature Granulocytes: 1 %
Lymphocytes Relative: 25 %
Lymphs Abs: 0.9 10*3/uL (ref 0.7–4.0)
MCH: 31.4 pg (ref 26.0–34.0)
MCHC: 32.6 g/dL (ref 30.0–36.0)
MCV: 96.4 fL (ref 80.0–100.0)
Monocytes Absolute: 0.6 10*3/uL (ref 0.1–1.0)
Monocytes Relative: 16 %
Neutro Abs: 1.9 10*3/uL (ref 1.7–7.7)
Neutrophils Relative %: 54 %
Platelets: 124 10*3/uL — ABNORMAL LOW (ref 150–400)
RBC: 4.14 MIL/uL — ABNORMAL LOW (ref 4.22–5.81)
RDW: 13.5 % (ref 11.5–15.5)
WBC: 3.5 10*3/uL — ABNORMAL LOW (ref 4.0–10.5)
nRBC: 0 % (ref 0.0–0.2)

## 2022-08-09 LAB — FERRITIN: Ferritin: 174 ng/mL (ref 24–336)

## 2022-08-09 LAB — IRON AND TIBC
Iron: 75 ug/dL (ref 45–182)
Saturation Ratios: 24 % (ref 17.9–39.5)
TIBC: 306 ug/dL (ref 250–450)
UIBC: 231 ug/dL

## 2022-08-09 LAB — D-DIMER, QUANTITATIVE: D-Dimer, Quant: 0.35 ug/mL-FEU (ref 0.00–0.50)

## 2022-08-15 NOTE — Progress Notes (Unsigned)
[address] [place], [place] [address]   CLINIC:  Medical Oncology/Hematology  PCP:  [name], MD [place] [id number] [phone]   REASON FOR VISIT:  Follow-up for splenic infarct  CURRENT THERAPY: Observation  INTERVAL HISTORY:  [name] 71 y.o. male returns for routine follow-up of splenic infarcts.  He was last seen by Tarri Abernethy PA-C on 08/16/2021.  At today's visit, he reports feeling fairly well.  No recent hospitalizations, surgeries, or changes in baseline health status.  He has not had any further issues with abdominal pain.  No signs or symptoms of DVT or PE.  He is not currently on any anticoagulation, but is on aspirin 81 mg daily.  No B symptoms such as fever, chills, night sweats, unintentional weight loss.  No changes in his breathing, bowel, or urinary habits.  No bleeding episodes such as hematemesis, hematochezia, melena, or epistaxis.  He denies any fatigue, chest pain, dyspnea on exertion, lightheadedness, or syncope.  He continues to follow-up with gastroenterology for his history of hiatal hernia, gastritis, and bleeding Cameron lesions.  He continues to take daily ferrous sulfate supplementation.  He has 80% energy and 100% appetite. He endorses that he is maintaining a stable weight.   REVIEW OF SYSTEMS:  Review of Systems  Constitutional:  Negative for appetite change, chills, diaphoresis, fatigue, fever and unexpected weight change.  HENT:   Negative for lump/mass and nosebleeds.   Eyes:  Negative for eye problems.  Respiratory:  Negative for cough, hemoptysis and shortness of breath.   Cardiovascular:  Negative for chest pain, leg swelling and palpitations.  Gastrointestinal:  Negative for abdominal pain, blood in stool, constipation, diarrhea, nausea and vomiting.  Genitourinary:  Negative for hematuria.   Musculoskeletal:  Positive for arthralgias.  Skin:  Positive for wound (x2  months).  Neurological:  Negative for dizziness, headaches and light-headedness.  Hematological:  Does not bruise/bleed easily.      PAST MEDICAL/SURGICAL HISTORY:  Past Medical History:  Diagnosis Date   Chest pain, exertional    Chronic kidney disease    kidney stones   Hiatal hernia    Hyperlipidemia    Hypertension    Skin cancer    Splenic infarct    Past Surgical History:  Procedure Laterality Date   COLONOSCOPY N/A 04/19/2014   XQJ:JHERDEYC hemorrhoids. Colonic diverticulosis. Single colonic polyp removed as described above   COLONOSCOPY WITH PROPOFOL N/A 05/29/2021   Procedure: COLONOSCOPY WITH PROPOFOL;  Surgeon: Daneil Dolin, MD;  [facility]: [facility];  Service: Endoscopy;  Laterality: N/A;  10:00am   ESOPHAGOGASTRODUODENOSCOPY N/A 04/19/2014   XKG:YJEHUDJ reflux esophagitis - benign-appearing peptic stricture  -  status post dilation as described above. 5 cm hiatal hernia. Gastric erosions.    Status post Venia Minks dilation and gastric biopsy   ESOPHAGOGASTRODUODENOSCOPY (EGD) WITH PROPOFOL N/A 05/29/2021   Procedure: ESOPHAGOGASTRODUODENOSCOPY (EGD) WITH PROPOFOL;  Surgeon: Daneil Dolin, MD;  [facility]: [facility];  Service: Endoscopy;  Laterality: N/A;   HERNIA REPAIR     X2   POLYPECTOMY  05/29/2021   Procedure: POLYPECTOMY;  Surgeon: Daneil Dolin, MD;  Location: [facility];  Service: Endoscopy;;     SOCIAL HISTORY:  Social History   Socioeconomic History   Marital status: Divorced    Spouse name: Not on file   Number of children: Not on file   Years of education: Not on file   Highest education  level: Not on file  Occupational History   Not on file  Tobacco Use   Smoking status: Every Day    Packs/day: 0.80    Years: 15.00    Total pack years: 12.00    Types: Cigarettes    Last attempt to quit: 10/01/2013    Years since quitting: 8.8   Smokeless tobacco: Never  Vaping Use   Vaping Use: Never used  Substance and Sexual Activity   Alcohol  use: Yes    Alcohol/week: 0.0 standard drinks of alcohol    Comment: Rare   Drug use: No   Sexual activity: Not on file  Other Topics Concern   Not on file  Social History Narrative   Not on file   Social Determinants of Health   Financial Resource Strain: Not on file  Food Insecurity: Not on file  Transportation Needs: Not on file  Physical Activity: Not on file  Stress: Not on file  Social Connections: Not on file  Intimate Partner Violence: Not At Risk (12/30/2020)   Humiliation, Afraid, Rape, and Kick questionnaire    Fear of Current or Ex-Partner: No    Emotionally Abused: No    Physically Abused: No    Sexually Abused: No    FAMILY HISTORY:  Family History  Problem Relation Age of Onset   Heart attack Mother 32       Cause of death   Heart attack Father 29       Cause of death   Hypertension Sister    Breast cancer Sister    Skin cancer Paternal Aunt    Colon cancer Neg Hx     CURRENT MEDICATIONS:  Outpatient Encounter Medications as of 08/16/2022  Medication Sig   acetaminophen (TYLENOL) 650 MG CR tablet Take 1,300 mg by mouth every 8 (eight) hours as needed for pain.   allopurinol (ZYLOPRIM) 100 MG tablet Take 200 mg by mouth daily.   amLODipine (NORVASC) 5 MG tablet Take 5 mg by mouth daily.   Ascorbic Acid (VITAMIN C) 1000 MG tablet Take 1,000 mg by mouth daily.   aspirin 81 MG tablet Take 81 mg by mouth daily.   Cholecalciferol (VITAMIN D3) 125 MCG (5000 UT) CAPS Take 5,000 Units by mouth daily.   Coenzyme Q10 (CO Q 10) 100 MG CAPS Take 200 mg by mouth daily.   Cyanocobalamin (VITAMIN B-12) 5000 MCG TBDP Take 5,000 mcg by mouth daily.   ferrous sulfate 325 (65 FE) MG tablet Take 325 mg by mouth daily with breakfast.   leflunomide (ARAVA) 20 MG tablet Take 20 mg by mouth daily.   lisinopril-hydrochlorothiazide (PRINZIDE,ZESTORETIC) 20-12.5 MG tablet TAKE 2 TABLETS BY MOUTH ONCE DAILY. (Patient taking differently: Take 2 tablets by mouth daily.)    metoprolol succinate (TOPROL-XL) 100 MG 24 hr tablet TAKE ONE TABLET BY MOUTH DAILY. TAKE WITH OR IMMEDIATELY FOLLOWING A MEAL. (Patient taking differently: Take 100 mg by mouth daily.)   Multiple Vitamins-Minerals (MULTIVITAMIN WITH MINERALS) tablet Take 1 tablet by mouth daily. Kidney essential   Omega-3 Fatty Acids (FISH OIL PO) Take 1,400 mg by mouth daily.   OVER THE COUNTER MEDICATION Take 2 tablets by mouth daily. Super Para [place] Prostate   pantoprazole (PROTONIX) 40 MG tablet Take 1 tablet (40 mg total) by mouth 2 (two) times daily.   polyethylene glycol-electrolytes (NULYTELY) 420 g solution As directed   polyethylene glycol-electrolytes (TRILYTE) 420 g solution Take 4,000 mLs by mouth as directed.   rosuvastatin (CRESTOR) 10 MG tablet Take  10 mg by mouth at bedtime.   spironolactone (ALDACTONE) 25 MG tablet TAKE ONE TABLET BY MOUTH DAILY. (Patient taking differently: Take 25 mg by mouth daily.)   sulfamethoxazole-trimethoprim (BACTRIM DS) 800-160 MG tablet Take 1 tablet by mouth daily as needed (Breakout on rear).   tiZANidine (ZANAFLEX) 2 MG tablet Take 2 mg by mouth every 8 (eight) hours as needed for muscle spasms.   zinc gluconate 50 MG tablet Take 50 mg by mouth daily.   No facility-administered encounter medications on file as of 08/16/2022.    ALLERGIES:  Allergies  Allergen Reactions   Codeine Nausea And Vomiting     PHYSICAL EXAM:  ECOG PERFORMANCE STATUS: 0 - Asymptomatic  There were no vitals filed for this visit. There were no vitals filed for this visit. Physical Exam Constitutional:      Appearance: Normal appearance. He is obese.  HENT:     Head: Normocephalic and atraumatic.     Mouth/Throat:     Mouth: Mucous membranes are moist.  Eyes:     Extraocular Movements: Extraocular movements intact.     Pupils: Pupils are equal, round, and reactive to light.  Cardiovascular:     Rate and Rhythm: Normal rate and regular rhythm.     Pulses: Normal pulses.      Heart sounds: Normal heart sounds.  Pulmonary:     Effort: Pulmonary effort is normal.     Breath sounds: Normal breath sounds.  Abdominal:     General: Bowel sounds are normal.     Palpations: Abdomen is soft.     Tenderness: There is no abdominal tenderness.  Musculoskeletal:        General: No swelling.     Right lower leg: No edema.     Left lower leg: No edema.  Lymphadenopathy:     Cervical: No cervical adenopathy.  Skin:    General: Skin is warm and dry.     Findings: Lesion (Sore on left lower leg, anterior region just distal to knee; thick scab with pinkish scar tissue surrounding; following with PCP and wound care) present.  Neurological:     General: No focal deficit present.     Mental Status: He is alert and oriented to person, place, and time.  Psychiatric:        Mood and Affect: Mood normal.        Behavior: Behavior normal.      LABORATORY DATA:  I have reviewed the labs as listed.  CBC    Component Value Date/Time   WBC 3.5 (L) 08/09/2022 0948   RBC 4.14 (L) 08/09/2022 0948   HGB 13.0 08/09/2022 0948   HCT 39.9 08/09/2022 0948   PLT 124 (L) 08/09/2022 0948   MCV 96.4 08/09/2022 0948   MCH 31.4 08/09/2022 0948   MCHC 32.6 08/09/2022 0948   RDW 13.5 08/09/2022 0948   LYMPHSABS 0.9 08/09/2022 0948   MONOABS 0.6 08/09/2022 0948   EOSABS 0.1 08/09/2022 0948   BASOSABS 0.0 08/09/2022 0948      Latest Ref Rng & Units 08/14/2021   10:33 AM 05/26/2021    9:11 AM 03/01/2021    9:09 AM  CMP  Glucose 70 - 99 mg/dL 107  126  112   BUN 8 - 23 mg/dL 29  35  35   Creatinine 0.61 - 1.24 mg/dL 1.66  1.80  1.74   Sodium 135 - 145 mmol/L 137  135  135   Potassium 3.5 - 5.1 mmol/L 4.2  3.9  4.3   Chloride 98 - 111 mmol/L 108  104  105   CO2 22 - 32 mmol/L '19  25  23   '$ Calcium 8.9 - 10.3 mg/dL 9.1  9.1  9.3   Total Protein 6.5 - 8.1 g/dL 7.2     Total Bilirubin 0.3 - 1.2 mg/dL 0.6     Alkaline Phos 38 - 126 U/L 109     AST 15 - 41 U/L 28     ALT 0 - 44 U/L 33        DIAGNOSTIC IMAGING:  I have independently reviewed the relevant imaging and discussed with the patient.  ASSESSMENT & PLAN: 1.  Cryptogenic splenic infarct - Presented to ED on 12/19/2020 with severe abdominal pain - CT abdomen/pelvis (12/19/2020): Splenic infarct along anterior spleen with other areas of low-attenuation potentially small infarcts as well; no perisplenic stranding - No clear reason for splenic infarct such as abdominal trauma, history of splenic infection, history of blood clots, or family history of hypercoagulable disorders.  No known history of sickle cell disease. - Hypercoagulable work-up showed no evidence of coagulopathy (PNH profile negative, protein S activity normal, protein C activity normal, Antithrombin III normal, cardiolipin antibodies negative, beta-2 glycoprotein and bodies negative, lupus anticoagulant panel negative, factor V Leiden negative, - Per note by Dr. Chryl Heck (01/27/2021), it is unclear if splenic infarct is incidental finding versus actual cause of patient's abdominal pain - Since patient had no other episodes, did not have any continued abdominal pain, or evidence of coagulopathy, Dr. Chryl Heck did not recommend any anticoagulation, but recommended continued follow-up with GI team - No current abdominal pain/nausea/vomiting.  No symptoms concerning for DVT/PE. - Most recent labs (08/09/2022): Normal D-dimer (0.35), normal Hgb 13.0 - PLAN: Per evaluation by Dr. Chryl Heck in April 2022, patient does not have strong indication for anticoagulation at that this time, especially since he has a history of GI bleeding.  However, if he has any recurrent splenic infarcts or new DVT/PE in the future, he would likely benefit from lifelong anticoagulation at that time. - Continue aspirin 81 mg daily  2.  History of iron deficiency anemia from GI bleeding - History of GI bleed in 2015, required 4 units PRBC transfusions during that time - Most recent EGD/colonoscopy  (05/29/2021) revealed large hiatal hernia and gastric erosions consistent with [name] lesions; polyps, diverticulosis, and nonbleeding internal hemorrhoids and colon - No obvious source of bleeding such as hematemesis, hematochezia, or melena - Most recent labs (08/09/2022): Hgb 13.0/MCV 96.4 ferritin 174, iron saturation 24%. - PLAN: Continue daily iron supplement.  Continue follow-up with GI.    3.  Leukopenia and thrombocytopenia - Incidental/isolated finding on CBC from 08/09/2022 with WBC 3.5, platelets 124, normal differential - CT abdomen/pelvis (12/19/2020) was negative for any obvious liver disease.  Patient had areas of splenic infarct on anterior spleen, but no splenomegaly noted per radiologist - PLAN: Repeat a CBC in 2 months.  If any persistent leukopenia and thrombocytopenia, we will initiate additional work-up at that time.    4.  Tobacco use - Smokes 0.5 - 0.75 PPD cigarettes x 15 years  - PLAN: Patient does not currently meet criteria for LDCT lung cancer screening program.,  But we have counseled on the importance of smoking cessation.  5.  Other history - Tobacco use as above.  No alcohol intake. - History of GI bleed, hypertension, questionable silent MI   PLAN SUMMARY: >> CBC/D only in 2 months  >>  Labs in 1 year (CBC/D, CMP, D-dimer, ferritin, iron/TIBC) >> Office visit in 1 year (1 week after labs)   All questions were answered. The patient knows to call the clinic with any problems, questions or concerns.  Medical decision making: Low   Time spent on visit: I spent 15 minutes counseling the patient face to face. The total time spent in the appointment was 22 minutes and more than 50% was on counseling.   [name], PA-C  08/16/2022 10:26 AM

## 2022-08-16 ENCOUNTER — Inpatient Hospital Stay: Payer: Medicare HMO | Admitting: Physician Assistant

## 2022-08-16 ENCOUNTER — Other Ambulatory Visit: Payer: Self-pay

## 2022-08-16 VITALS — BP 123/84 | HR 83 | Temp 98.9°F | Resp 16 | Ht 69.0 in | Wt 199.9 lb

## 2022-08-16 DIAGNOSIS — D735 Infarction of spleen: Secondary | ICD-10-CM | POA: Diagnosis not present

## 2022-08-16 DIAGNOSIS — D509 Iron deficiency anemia, unspecified: Secondary | ICD-10-CM

## 2022-08-16 NOTE — Patient Instructions (Signed)
[place] at [facility] Discharge Instructions  You were seen today by Tarri Abernethy PA-C for your history of splenic infarct, which may or may not have been caused by a blood clot at some point in the past.  We do not believe that you need to be on any stronger blood thinners at this time, since your blood did not show any blood clotting disorders and you have not had any previous episodes of abnormal blood clots.  However, if you have any issues with blood clots in the future (such as blood clots in your legs or lungs, or any new issues with your spleen), we may put you on a blood thinner in the future.  However, this would need to be carefully weighed with the fact that you have a history of bleeding from your stomach/intestines.  ** Your white blood cells and platelets are slightly lower than normal.  This may be a one-time abnormality that is nothing to worry about, but to be on the safe side I would like to check your blood counts again in 2 months.  If they are still abnormal, we will call you and schedule you for additional testing.  Otherwise, we will plan on seeing you again in 1 year.  MEDICATIONS: No changes to home medications.  Continue to take aspirin and iron pill as prescribed.  FOLLOW-UP APPOINTMENT: Office visit in 1 year, after labs  ** Thank you for trusting me with your healthcare!  I strive to provide all of my patients with quality care at each visit.  If you receive a survey for this visit, I would be so grateful to you for taking the time to provide feedback.  Thank you in advance!  ~ Oney Folz                   Dr. Derek Jack   &   Tarri Abernethy, PA-C   - - - - - - - - - - - - - - - - - -     Thank you for choosing [place] at [facility] to provide your oncology and hematology care.  To afford each patient quality time with our provider, please arrive at least 15 minutes before your scheduled  appointment time.   If you have a lab appointment with the [facility] please come in thru the Main Entrance and check in at the main information desk.  You need to re-schedule your appointment should you arrive 10 or more minutes late.  We strive to give you quality time with our providers, and arriving late affects you and other patients whose appointments are after yours.  Also, if you no show three or more times for appointments you may be dismissed from the clinic at the providers discretion.     Again, thank you for choosing [facility].  Our hope is that these requests will decrease the amount of time that you wait before being seen by our physicians.       _____________________________________________________________  Should you have questions after your visit to [facility], please contact our office at [phone] and follow the prompts.  Our office hours are 8:00 a.m. and 4:30 p.m. Monday - Friday.  Please note that voicemails left after 4:00 p.m. may not be returned until the following business day.  We are closed weekends and major holidays.  You do have access to a nurse 24-7, just call  the main number to the clinic [phone] and do not press any options, hold on the line and a nurse will answer the phone.    For prescription refill requests, have your pharmacy contact our office and allow 72 hours.    Due to Covid, you will need to wear a mask upon entering the hospital. If you do not have a mask, a mask will be given to you at the Main Entrance upon arrival. For doctor visits, patients may have 1 support person age 63 or older with them. For treatment visits, patients can not have anyone with them due to social distancing guidelines and our immunocompromised population.

## 2022-10-19 ENCOUNTER — Inpatient Hospital Stay: Payer: Medicare HMO | Attending: Physician Assistant

## 2022-10-19 DIAGNOSIS — D72819 Decreased white blood cell count, unspecified: Secondary | ICD-10-CM | POA: Diagnosis present

## 2022-10-19 DIAGNOSIS — D509 Iron deficiency anemia, unspecified: Secondary | ICD-10-CM

## 2022-10-19 DIAGNOSIS — D696 Thrombocytopenia, unspecified: Secondary | ICD-10-CM | POA: Insufficient documentation

## 2022-10-19 DIAGNOSIS — D735 Infarction of spleen: Secondary | ICD-10-CM

## 2022-10-19 LAB — CBC WITH DIFFERENTIAL/PLATELET
Abs Immature Granulocytes: 0.02 10*3/uL (ref 0.00–0.07)
Basophils Absolute: 0 10*3/uL (ref 0.0–0.1)
Basophils Relative: 1 %
Eosinophils Absolute: 0.2 10*3/uL (ref 0.0–0.5)
Eosinophils Relative: 4 %
HCT: 44.4 % (ref 39.0–52.0)
Hemoglobin: 14.1 g/dL (ref 13.0–17.0)
Immature Granulocytes: 0 %
Lymphocytes Relative: 15 %
Lymphs Abs: 0.8 10*3/uL (ref 0.7–4.0)
MCH: 31.3 pg (ref 26.0–34.0)
MCHC: 31.8 g/dL (ref 30.0–36.0)
MCV: 98.7 fL (ref 80.0–100.0)
Monocytes Absolute: 0.6 10*3/uL (ref 0.1–1.0)
Monocytes Relative: 11 %
Neutro Abs: 4 10*3/uL (ref 1.7–7.7)
Neutrophils Relative %: 69 %
Platelets: 184 10*3/uL (ref 150–400)
RBC: 4.5 MIL/uL (ref 4.22–5.81)
RDW: 13.8 % (ref 11.5–15.5)
WBC: 5.7 10*3/uL (ref 4.0–10.5)
nRBC: 0 % (ref 0.0–0.2)

## 2022-10-23 ENCOUNTER — Telehealth: Payer: Self-pay

## 2022-10-23 NOTE — Telephone Encounter (Signed)
-----  Message from [name], [place] sent at 10/19/2022 11:23 AM EST ----- Jeremiah Calhoun - his labs from Friday looked good.  Please call to let him know that his white blood cells and platelets are back to normal.  He does not need any extra testing at this time, and he can follow-up with me as scheduled later this year.  Thanks!   ----- Message ----- From: [name], PA-C Sent: 10/19/2022  12:00 AM EST To: [name], PA-C  Got labs today 9:00 AM Friday, 10/19/2022 - REVIEW RESULTS (CBC/D only)  We are rechecking CBC due to new finding of leukopenia and thrombocytopenia from a CBC in November 2023.  If he has persistently low platelets or WBCs, will need to schedule for additional lab work and follow-up.

## 2022-10-23 NOTE — Telephone Encounter (Signed)
No answer for 10/22/22.  Voice mail left on 10/23/2022 to call the [facility] for lab results and instructions.

## 2022-10-24 NOTE — Telephone Encounter (Signed)
Patient returned phone call.  Reviewed instructions for labs and next appointment with understanding verbalized.

## 2022-11-03 IMAGING — DX DG CHEST 1V PORT
1 series · 1 of 1 positions shown · non-contrast
Comparison: Chest radiograph dated 04/16/2014

CLINICAL DATA: 69-year-old male with shortness of breath.

EXAM:
PORTABLE CHEST 1 VIEW

[chest ap]
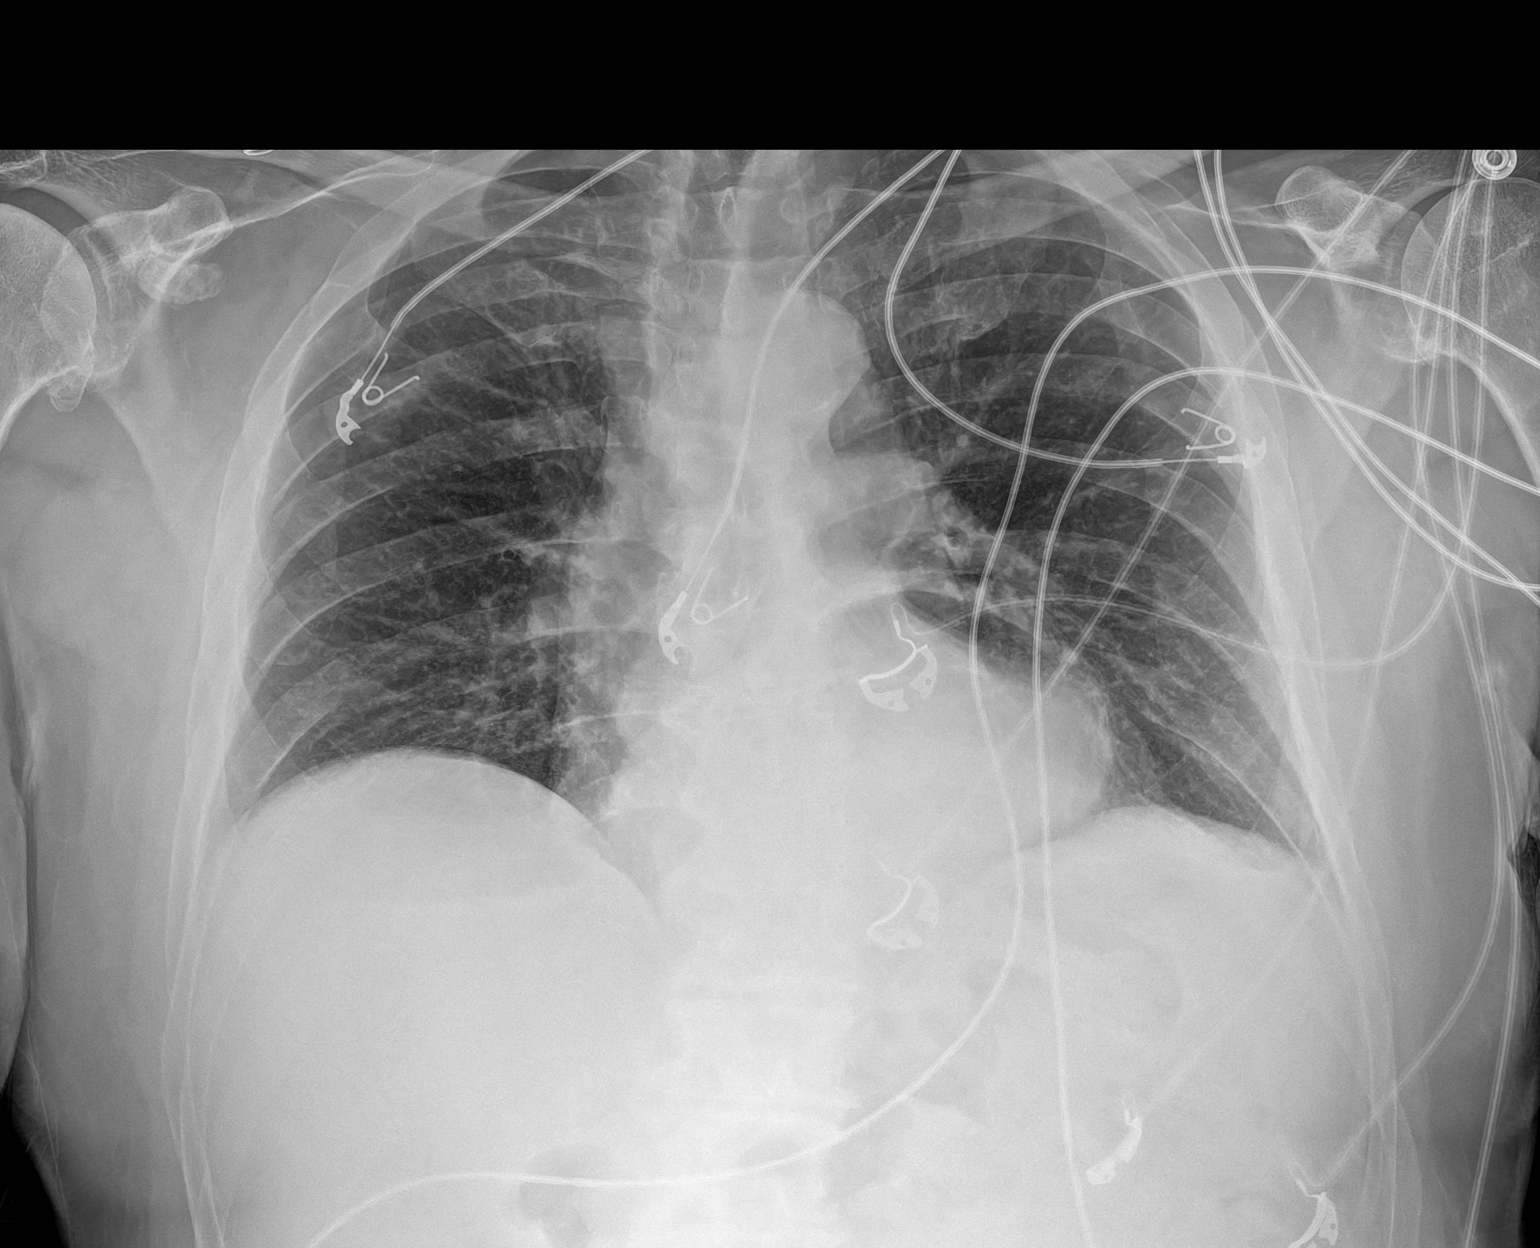

[1 of 1 positions shown; findings below may reference images not displayed]

FINDINGS: No focal consolidation, pleural effusion, pneumothorax. The cardiac
silhouette is within limits. There is a moderate size hiatal hernia.
Atherosclerotic calcification of the aorta. No acute osseous
pathology.
IMPRESSION: 1. No acute cardiopulmonary process.
2. Hiatal hernia.

## 2022-11-05 ENCOUNTER — Other Ambulatory Visit: Payer: Self-pay

## 2022-11-05 ENCOUNTER — Emergency Department (HOSPITAL_COMMUNITY)
Admission: EM | Admit: 2022-11-05 | Discharge: 2022-11-05 | Disposition: A | Discharge status: Home / Self Care | Payer: Medicare HMO | Attending: Emergency Medicine | Admitting: Emergency Medicine

## 2022-11-05 ENCOUNTER — Emergency Department (HOSPITAL_COMMUNITY): Payer: Medicare HMO

## 2022-11-05 ENCOUNTER — Encounter (HOSPITAL_COMMUNITY): Payer: Self-pay | Admitting: Emergency Medicine

## 2022-11-05 DIAGNOSIS — K449 Diaphragmatic hernia without obstruction or gangrene: Secondary | ICD-10-CM | POA: Diagnosis not present

## 2022-11-05 DIAGNOSIS — Z85828 Personal history of other malignant neoplasm of skin: Secondary | ICD-10-CM | POA: Insufficient documentation

## 2022-11-05 DIAGNOSIS — Z7982 Long term (current) use of aspirin: Secondary | ICD-10-CM | POA: Insufficient documentation

## 2022-11-05 DIAGNOSIS — R079 Chest pain, unspecified: Secondary | ICD-10-CM | POA: Diagnosis present

## 2022-11-05 DIAGNOSIS — I129 Hypertensive chronic kidney disease with stage 1 through stage 4 chronic kidney disease, or unspecified chronic kidney disease: Secondary | ICD-10-CM | POA: Insufficient documentation

## 2022-11-05 DIAGNOSIS — Z79899 Other long term (current) drug therapy: Secondary | ICD-10-CM | POA: Insufficient documentation

## 2022-11-05 DIAGNOSIS — N189 Chronic kidney disease, unspecified: Secondary | ICD-10-CM | POA: Diagnosis not present

## 2022-11-05 LAB — HEPATIC FUNCTION PANEL
ALT: 39 U/L (ref 0–44)
AST: 31 U/L (ref 15–41)
Albumin: 4.3 g/dL (ref 3.5–5.0)
Alkaline Phosphatase: 97 U/L (ref 38–126)
Bilirubin, Direct: 0.2 mg/dL (ref 0.0–0.2)
Indirect Bilirubin: 0.9 mg/dL (ref 0.3–0.9)
Total Bilirubin: 1.1 mg/dL (ref 0.3–1.2)
Total Protein: 7.2 g/dL (ref 6.5–8.1)

## 2022-11-05 LAB — TROPONIN I (HIGH SENSITIVITY)
Troponin I (High Sensitivity): 4 ng/L (ref <18)
Troponin I (High Sensitivity): 3 ng/L (ref <18)

## 2022-11-05 LAB — CBC
HCT: 46.9 % (ref 39.0–52.0)
Hemoglobin: 15.5 g/dL (ref 13.0–17.0)
MCH: 31.3 pg (ref 26.0–34.0)
MCHC: 33 g/dL (ref 30.0–36.0)
MCV: 94.6 fL (ref 80.0–100.0)
Platelets: 207 10*3/uL (ref 150–400)
RBC: 4.96 MIL/uL (ref 4.22–5.81)
RDW: 13.1 % (ref 11.5–15.5)
WBC: 9.5 10*3/uL (ref 4.0–10.5)
nRBC: 0 % (ref 0.0–0.2)

## 2022-11-05 LAB — BASIC METABOLIC PANEL
Anion gap: 14 (ref 5–15)
BUN: 28 mg/dL — ABNORMAL HIGH (ref 8–23)
CO2: 21 mmol/L — ABNORMAL LOW (ref 22–32)
Calcium: 9.7 mg/dL (ref 8.9–10.3)
Chloride: 103 mmol/L (ref 98–111)
Creatinine, Ser: 1.7 mg/dL — ABNORMAL HIGH (ref 0.61–1.24)
GFR, Estimated: 43 mL/min — ABNORMAL LOW (ref >60)
Glucose, Bld: 141 mg/dL — ABNORMAL HIGH (ref 70–99)
Potassium: 3.7 mmol/L (ref 3.5–5.1)
Sodium: 138 mmol/L (ref 135–145)

## 2022-11-05 LAB — LIPASE, BLOOD: Lipase: 32 U/L (ref 11–51)

## 2022-11-05 MED ORDER — HYDROMORPHONE HCL 1 MG/ML IJ SOLN
2.0000 mg | Freq: Once | INTRAMUSCULAR | Status: AC
  Administered 2022-11-05: 2 mg via INTRAVENOUS
  Filled 2022-11-05: qty 2

## 2022-11-05 MED ORDER — SODIUM CHLORIDE 0.9 % IV BOLUS
1000.0000 mL | Freq: Once | INTRAVENOUS | Status: AC
  Administered 2022-11-05: 1000 mL via INTRAVENOUS

## 2022-11-05 MED ORDER — ALUM & MAG HYDROXIDE-SIMETH 200-200-20 MG/5ML PO SUSP
30.0000 mL | Freq: Once | ORAL | Status: AC
  Administered 2022-11-05: 30 mL via ORAL
  Filled 2022-11-05: qty 30

## 2022-11-05 MED ORDER — HYDROMORPHONE HCL 1 MG/ML IJ SOLN
1.0000 mg | Freq: Once | INTRAMUSCULAR | Status: AC
  Administered 2022-11-05: 1 mg via INTRAVENOUS
  Filled 2022-11-05: qty 1

## 2022-11-05 MED ORDER — HYDROCODONE-ACETAMINOPHEN 5-325 MG PO TABS: 1.0000 | ORAL_TABLET | ORAL | 0 refills | Status: DC | PRN

## 2022-11-05 MED ORDER — ONDANSETRON 4 MG PO TBDP: 4.0000 mg | ORAL_TABLET | Freq: Three times a day (TID) | ORAL | 0 refills | Status: DC | PRN

## 2022-11-05 MED ORDER — IOHEXOL 350 MG/ML SOLN
75.0000 mL | Freq: Once | INTRAVENOUS | Status: AC | PRN
  Administered 2022-11-05: 75 mL via INTRAVENOUS

## 2022-11-05 MED ORDER — MORPHINE SULFATE (PF) 4 MG/ML IV SOLN
4.0000 mg | Freq: Once | INTRAVENOUS | Status: AC
  Administered 2022-11-05: 4 mg via INTRAVENOUS
  Filled 2022-11-05: qty 1

## 2022-11-05 MED ORDER — ONDANSETRON HCL 4 MG/2ML IJ SOLN
4.0000 mg | Freq: Once | INTRAMUSCULAR | Status: AC
  Administered 2022-11-05: 4 mg via INTRAVENOUS
  Filled 2022-11-05: qty 2

## 2022-11-05 NOTE — ED Triage Notes (Signed)
Pt c/o chest pain with nausea and sweating x 40 minutes.

## 2022-11-05 NOTE — ED Notes (Signed)
Patient transported to CT 

## 2022-11-05 NOTE — ED Provider Notes (Signed)
[facility] Provider Note   CSN: [id number] Arrival date & time: 11/05/22  2040     History  Chief Complaint  Patient presents with   Chest Pain    Jeremiah Calhoun is a 72 y.o. male.  Pt is a 72 yo male with pmhx significant for htn, hld, ckd, hiatal hernia, splenic infarct, and skin cancer.  Pt developed cp, upper abd pain, nausea for the past 40 minutes.  Pt denies any f/c.  No cough.       Home Medications Prior to Admission medications   Medication Sig Start Date End Date Taking? Authorizing Provider  allopurinol (ZYLOPRIM) 100 MG tablet Take 200 mg by mouth daily. 12/11/20  Yes [provider]  amLODipine (NORVASC) 5 MG tablet Take 5 mg by mouth daily. 12/03/20  Yes [provider]  Ascorbic Acid (VITAMIN C) 1000 MG tablet Take 1,000 mg by mouth daily.   Yes [provider]  aspirin 81 MG tablet Take 81 mg by mouth daily.   Yes [provider]  Cholecalciferol (VITAMIN D3) 125 MCG (5000 UT) CAPS Take 5,000 Units by mouth daily.   Yes [provider]  Coenzyme Q10 (CO Q 10) 100 MG CAPS Take 200 mg by mouth daily.   Yes [provider]  Cyanocobalamin (VITAMIN B-12) 5000 MCG TBDP Take 5,000 mcg by mouth daily.   Yes [provider]  ferrous sulfate 325 (65 FE) MG tablet Take 325 mg by mouth daily with breakfast.   Yes [provider]  HYDROcodone-acetaminophen (NORCO/VICODIN) 5-325 MG tablet Take 1 tablet by mouth every 4 (four) hours as needed. 11/05/22  Yes Isla Pence, MD  leflunomide (ARAVA) 20 MG tablet Take 20 mg by mouth daily. 12/02/20  Yes [provider]  lisinopril-hydrochlorothiazide (PRINZIDE,ZESTORETIC) 20-12.5 MG tablet TAKE 2 TABLETS BY MOUTH ONCE DAILY. Patient taking differently: Take 2 tablets by mouth daily. 07/05/16  Yes Lendon Colonel, NP  metoprolol succinate (TOPROL-XL) 100 MG 24 hr tablet TAKE ONE TABLET BY MOUTH DAILY. TAKE WITH  OR IMMEDIATELY FOLLOWING A MEAL. Patient taking differently: Take 100 mg by mouth daily. 05/02/16  Yes Lendon Colonel, NP  Multiple Vitamins-Minerals (MULTIVITAMIN WITH MINERALS) tablet Take 1 tablet by mouth daily. Kidney essential   Yes [provider]  Omega-3 Fatty Acids (FISH OIL PO) Take 1,400 mg by mouth daily.   Yes [provider]  ondansetron (ZOFRAN-ODT) 4 MG disintegrating tablet Take 1 tablet (4 mg total) by mouth every 8 (eight) hours as needed. 11/05/22  Yes Isla Pence, MD  OVER THE COUNTER MEDICATION Take 2 tablets by mouth daily. Super Bate Prostate   Yes [provider]  pantoprazole (PROTONIX) 40 MG tablet Take 1 tablet (40 mg total) by mouth 2 (two) times daily. 04/20/14  Yes McInnis, Luster Landsberg, MD  rosuvastatin (CRESTOR) 10 MG tablet Take 10 mg by mouth at bedtime.   Yes [provider]  spironolactone (ALDACTONE) 25 MG tablet TAKE ONE TABLET BY MOUTH DAILY. Patient taking differently: Take 25 mg by mouth daily. 04/25/16  Yes Herminio Commons, MD  tiZANidine (ZANAFLEX) 2 MG tablet Take 2 mg by mouth every 8 (eight) hours as needed for muscle spasms. 10/04/20  Yes [provider]  zinc gluconate 50 MG tablet Take 50 mg by mouth daily.   Yes [provider]  acetaminophen (TYLENOL) 650 MG CR tablet Take 1,300 mg by mouth every 8 (eight) hours as needed for pain.  [provider]      Allergies    Codeine    Review of Systems   Review of Systems  Cardiovascular:  Positive for chest pain.  Gastrointestinal:  Positive for abdominal pain and nausea.  All other systems reviewed and are negative.   Physical Exam Updated Vital Signs BP (!) 165/97   Pulse 97   Temp 97.8 F (36.6 C) (Oral)   Resp (!) 21   Ht '5\' 9"'$  (1.753 m)   Wt 90 kg   SpO2 97%   BMI 29.30 kg/m  Physical Exam Vitals and nursing note reviewed.  Constitutional:      Appearance: He is well-developed. He is ill-appearing and diaphoretic.   HENT:     Head: Normocephalic and atraumatic.  Eyes:     Extraocular Movements: Extraocular movements intact.     Pupils: Pupils are equal, round, and reactive to light.  Cardiovascular:     Rate and Rhythm: Normal rate and regular rhythm.     Heart sounds: Normal heart sounds.  Pulmonary:     Effort: Pulmonary effort is normal.     Breath sounds: Normal breath sounds.  Abdominal:     General: Bowel sounds are normal.     Palpations: Abdomen is soft.     Tenderness: There is abdominal tenderness in the epigastric area.  Musculoskeletal:        General: Normal range of motion.     Cervical back: Normal range of motion and neck supple.  Skin:    Capillary Refill: Capillary refill takes less than 2 seconds.  Neurological:     General: No focal deficit present.     Mental Status: He is alert and oriented to person, place, and time.  Psychiatric:        Mood and Affect: Mood normal.        Behavior: Behavior normal.     ED Results / Procedures / Treatments   Labs (all labs ordered are listed, but only abnormal results are displayed) Labs Reviewed  BASIC METABOLIC PANEL - Abnormal; Notable for the following components:      Result Value   CO2 21 (*)    Glucose, Bld 141 (*)    BUN 28 (*)    Creatinine, Ser 1.70 (*)    GFR, Estimated 43 (*)    All other components within normal limits  CBC  HEPATIC FUNCTION PANEL  LIPASE, BLOOD  URINALYSIS, ROUTINE W REFLEX MICROSCOPIC  TROPONIN I (HIGH SENSITIVITY)  TROPONIN I (HIGH SENSITIVITY)    EKG EKG Interpretation  Date/Time:  Monday November 05 2022 20:47:46 EST Ventricular Rate:  77 PR Interval:  175 QRS Duration: 95 QT Interval:  372 QTC Calculation: 421 R Axis:   -34 Text Interpretation: Sinus or ectopic atrial rhythm Inferior infarct, old No significant change since last tracing Confirmed by Isla Pence [id number]) on 11/05/2022 8:50:27 PM  Radiology CT Angio Chest/Abd/Pel for Dissection W and/or Wo  Contrast  Result Date: 11/05/2022 CLINICAL DATA:  Acute aortic syndrome suspected. EXAM: CT ANGIOGRAPHY CHEST, ABDOMEN AND PELVIS TECHNIQUE: Non-contrast CT of the chest was initially obtained. Multidetector CT imaging through the chest, abdomen and pelvis was performed using the standard protocol during bolus administration of intravenous contrast. Multiplanar reconstructed images and MIPs were obtained and reviewed to evaluate the vascular anatomy. RADIATION DOSE REDUCTION: This exam was performed according to the departmental dose-optimization program which includes automated exposure control, adjustment of the mA and/or kV according to patient size and/or use of  iterative reconstruction technique. CONTRAST:  64m OMNIPAQUE IOHEXOL 350 MG/ML SOLN COMPARISON:  CT abdomen pelvis dated 12/19/2020. FINDINGS: CTA CHEST FINDINGS Cardiovascular: There is no cardiomegaly or pericardial effusion. There is coronary vascular calcification. Mild atherosclerotic calcification of the thoracic aorta. No aneurysmal dilatation or dissection. The origins of the great vessels of the aortic arch appear patent. The central pulmonary arteries are grossly unremarkable for the degree of opacification. Mediastinum/Nodes: No hilar or mediastinal adenopathy. There is a large hiatal hernia containing portion of the stomach. The esophagus is grossly unremarkable. No mediastinal fluid collection. Lungs/Pleura: There is a 2 mm nodule in the right middle lobe (78/7). No focal consolidation, pleural effusion, or pneumothorax. The central airways are patent. Musculoskeletal: Degenerative changes of the spine. No acute osseous pathology. Bilateral gynecomastia. Review of the MIP images confirms the above findings. CTA ABDOMEN AND PELVIS FINDINGS VASCULAR Aorta: Mild atherosclerotic calcification of the abdominal aorta. No aneurysmal dilatation or dissection. No periaortic fluid collection. Celiac: The celiac artery and its major branches are  patent. SMA: The SMA is patent. Renals: The renal arteries are patent. IMA: The IMA is patent. Inflow: Mild atherosclerotic calcification of the iliac arteries. The iliac arteries are patent. Veins: The IVC is unremarkable. The SMV, splenic vein, and main portal vein are patent. No portal venous gas. Review of the MIP images confirms the above findings. NON-VASCULAR No intra-abdominal free air or free fluid. Hepatobiliary: The liver is grossly unremarkable. No biliary dilatation. No calcified gallstone or pericholecystic fluid. Pancreas: Unremarkable. No pancreatic ductal dilatation or surrounding inflammatory changes. Spleen: Normal in size without focal abnormality. Adrenals/Urinary Tract: Mild right adrenal thickening. Indeterminate 1.5 cm left adrenal nodule, similar to prior CT, possibly an adenoma. This can be better evaluated with MRI on a nonemergent/outpatient basis if clinically indicated. There is mild bilateral renal parenchyma atrophy, left greater right. Small bilateral renal cysts and additional subcentimeter hypodense lesions which are too small to characterize. Multiple nonobstructing bilateral renal calculi measure up to 10 mm in the lower pole of the right kidney. There is no hydronephrosis on either side. The visualized ureters and urinary bladder appear unremarkable. Stomach/Bowel: There is a large hiatal hernia containing a portion of the stomach. There is severe sigmoid diverticulosis with muscular hypertrophy. No active inflammatory changes. There is no bowel obstruction or active inflammation. The appendix is normal. Lymphatic: No adenopathy. Reproductive: The prostate and seminal vesicles are grossly unremarkable. No pelvic mass. Other: Small fat containing bilateral inguinal hernias. Musculoskeletal: Degenerative changes of the spine. No acute osseous pathology. Review of the MIP images confirms the above findings. IMPRESSION: 1. No acute intrathoracic, abdominal, or pelvic pathology. No  aortic aneurysm or dissection. 2. Large hiatal hernia containing a portion of the stomach. 3. Severe sigmoid diverticulosis. No bowel obstruction. Normal appendix. 4. Multiple nonobstructing bilateral renal calculi. No hydronephrosis. Electronically Signed   By: AAnner CreteM.D.   On: 11/05/2022 22:53   DG Chest Port 1 View  Result Date: 11/05/2022 CLINICAL DATA:  Chest pain. EXAM: PORTABLE CHEST 1 VIEW COMPARISON:  Chest radiograph dated 12/19/2020. FINDINGS: No focal consolidation, pleural effusion or pneumothorax. Stable cardiac silhouette. Atherosclerotic calcification of the aortic arch. Moderate size hiatal hernia. No acute osseous pathology. IMPRESSION: 1. No active disease. 2. Moderate size hiatal hernia. Electronically Signed   By: AAnner CreteM.D.   On: 11/05/2022 21:31    Procedures Procedures    Medications Ordered in ED Medications  morphine (PF) 4 MG/ML injection 4 mg (4 mg Intravenous Given  11/05/22 2110)  ondansetron (ZOFRAN) injection 4 mg (4 mg Intravenous Given 11/05/22 2109)  sodium chloride 0.9 % bolus 1,000 mL (0 mLs Intravenous Stopped 11/05/22 2236)  HYDROmorphone (DILAUDID) injection 1 mg (1 mg Intravenous Given 11/05/22 2122)  iohexol (OMNIPAQUE) 350 MG/ML injection 75 mL (75 mLs Intravenous Contrast Given 11/05/22 2224)  HYDROmorphone (DILAUDID) injection 2 mg (2 mg Intravenous Given 11/05/22 2235)  alum & mag hydroxide-simeth (MAALOX/MYLANTA) 200-200-20 MG/5ML suspension 30 mL (30 mLs Oral Given 11/05/22 2310)    ED Course/ Medical Decision Making/ A&P                             Medical Decision Making Amount and/or Complexity of Data Reviewed Labs: ordered. Radiology: ordered.  Risk OTC drugs. Prescription drug management.   This patient presents to the ED for concern of cp, this involves an extensive number of treatment options, and is a complaint that carries with it a high risk of complications and morbidity.  The differential diagnosis includes cardiac,  abd, pulm   Co morbidities that complicate the patient evaluation  htn, hld, ckd, hiatal hernia, splenic infarct, and skin cancer.   Additional history obtained:  Additional history obtained from epic chart review External records from outside source obtained and reviewed including wife   Lab Tests:  I Ordered, and personally interpreted labs.  The pertinent results include:  cbc nl, bmp with bun 28 and cr 1.7 (chronic); trop nl; lip nl   Imaging Studies ordered:  I ordered imaging studies including cxr and ct chest/abd/pelvis I independently visualized and interpreted imaging which showed  CXR: . No active disease.  2. Moderate size hiatal hernia.  CT chest/abd/pelvis:  No acute intrathoracic, abdominal, or pelvic pathology. No aortic  aneurysm or dissection.  2. Large hiatal hernia containing a portion of the stomach.  3. Severe sigmoid diverticulosis. No bowel obstruction. Normal  appendix.  4. Multiple nonobstructing bilateral renal calculi. No  hydronephrosis.   I agree with the radiologist interpretation   Cardiac Monitoring:  The patient was maintained on a cardiac monitor.  I personally viewed and interpreted the cardiac monitored which showed an underlying rhythm of: nsr   Medicines ordered and prescription drug management:  I ordered medication including morphine/dilaudid  for pain  Reevaluation of the patient after these medicines showed that the patient improved I have reviewed the patients home medicines and have made adjustments as needed   Test Considered:  ct   Critical Interventions:  Pain control   Problem List / ED Course:  Epigastric pain:  pt has a very large hiatal hernia.  I think this is the source of his pain.  He is feeling much better now.  He is instructed to return if worse.  F/u with surgery.   Reevaluation:  After the interventions noted above, I reevaluated the patient and found that they have :improved   Social  Determinants of Health:  Lives at home with wife   Dispostion:  After consideration of the diagnostic results and the patients response to treatment, I feel that the patent would benefit from discharge with outpatient f/u.          Final Clinical Impression(s) / ED Diagnoses Final diagnoses:  Hiatal hernia    Rx / DC Orders ED Discharge Orders          Ordered    HYDROcodone-acetaminophen (NORCO/VICODIN) 5-325 MG tablet  Every 4 hours PRN  11/05/22 2353    ondansetron (ZOFRAN-ODT) 4 MG disintegrating tablet  Every 8 hours PRN        11/05/22 2353              Isla Pence, MD 11/05/22 2354

## 2023-08-08 ENCOUNTER — Other Ambulatory Visit: Payer: Self-pay

## 2023-08-08 DIAGNOSIS — D509 Iron deficiency anemia, unspecified: Secondary | ICD-10-CM

## 2023-08-09 ENCOUNTER — Inpatient Hospital Stay: Payer: Medicare HMO

## 2023-08-12 ENCOUNTER — Inpatient Hospital Stay: Payer: Medicare HMO | Attending: Physician Assistant

## 2023-08-12 DIAGNOSIS — N189 Chronic kidney disease, unspecified: Secondary | ICD-10-CM | POA: Insufficient documentation

## 2023-08-12 DIAGNOSIS — R350 Frequency of micturition: Secondary | ICD-10-CM | POA: Insufficient documentation

## 2023-08-12 DIAGNOSIS — E785 Hyperlipidemia, unspecified: Secondary | ICD-10-CM | POA: Insufficient documentation

## 2023-08-12 DIAGNOSIS — Z79899 Other long term (current) drug therapy: Secondary | ICD-10-CM | POA: Diagnosis not present

## 2023-08-12 DIAGNOSIS — Z7982 Long term (current) use of aspirin: Secondary | ICD-10-CM | POA: Diagnosis not present

## 2023-08-12 DIAGNOSIS — R059 Cough, unspecified: Secondary | ICD-10-CM | POA: Insufficient documentation

## 2023-08-12 DIAGNOSIS — F17210 Nicotine dependence, cigarettes, uncomplicated: Secondary | ICD-10-CM | POA: Diagnosis not present

## 2023-08-12 DIAGNOSIS — D735 Infarction of spleen: Secondary | ICD-10-CM | POA: Insufficient documentation

## 2023-08-12 DIAGNOSIS — D696 Thrombocytopenia, unspecified: Secondary | ICD-10-CM | POA: Insufficient documentation

## 2023-08-12 DIAGNOSIS — Z85828 Personal history of other malignant neoplasm of skin: Secondary | ICD-10-CM | POA: Insufficient documentation

## 2023-08-12 DIAGNOSIS — I129 Hypertensive chronic kidney disease with stage 1 through stage 4 chronic kidney disease, or unspecified chronic kidney disease: Secondary | ICD-10-CM | POA: Insufficient documentation

## 2023-08-12 DIAGNOSIS — D509 Iron deficiency anemia, unspecified: Secondary | ICD-10-CM

## 2023-08-12 DIAGNOSIS — K449 Diaphragmatic hernia without obstruction or gangrene: Secondary | ICD-10-CM | POA: Diagnosis not present

## 2023-08-12 DIAGNOSIS — D72819 Decreased white blood cell count, unspecified: Secondary | ICD-10-CM | POA: Diagnosis present

## 2023-08-12 DIAGNOSIS — D500 Iron deficiency anemia secondary to blood loss (chronic): Secondary | ICD-10-CM | POA: Diagnosis not present

## 2023-08-12 DIAGNOSIS — Z803 Family history of malignant neoplasm of breast: Secondary | ICD-10-CM | POA: Diagnosis not present

## 2023-08-12 DIAGNOSIS — I219 Acute myocardial infarction, unspecified: Secondary | ICD-10-CM | POA: Insufficient documentation

## 2023-08-12 DIAGNOSIS — Z7901 Long term (current) use of anticoagulants: Secondary | ICD-10-CM | POA: Insufficient documentation

## 2023-08-12 LAB — CBC WITH DIFFERENTIAL/PLATELET
Abs Immature Granulocytes: 0.02 10*3/uL (ref 0.00–0.07)
Basophils Absolute: 0.1 10*3/uL (ref 0.0–0.1)
Basophils Relative: 1 %
Eosinophils Absolute: 0.3 10*3/uL (ref 0.0–0.5)
Eosinophils Relative: 6 %
HCT: 47.2 % (ref 39.0–52.0)
Hemoglobin: 15 g/dL (ref 13.0–17.0)
Immature Granulocytes: 0 %
Lymphocytes Relative: 26 %
Lymphs Abs: 1.5 10*3/uL (ref 0.7–4.0)
MCH: 31.3 pg (ref 26.0–34.0)
MCHC: 31.8 g/dL (ref 30.0–36.0)
MCV: 98.3 fL (ref 80.0–100.0)
Monocytes Absolute: 0.7 10*3/uL (ref 0.1–1.0)
Monocytes Relative: 13 %
Neutro Abs: 3 10*3/uL (ref 1.7–7.7)
Neutrophils Relative %: 54 %
Platelets: 195 10*3/uL (ref 150–400)
RBC: 4.8 MIL/uL (ref 4.22–5.81)
RDW: 13.2 % (ref 11.5–15.5)
WBC: 5.6 10*3/uL (ref 4.0–10.5)
nRBC: 0 % (ref 0.0–0.2)

## 2023-08-12 LAB — COMPREHENSIVE METABOLIC PANEL
ALT: 37 U/L (ref 0–44)
AST: 32 U/L (ref 15–41)
Albumin: 4 g/dL (ref 3.5–5.0)
Alkaline Phosphatase: 107 U/L (ref 38–126)
Anion gap: 10 (ref 5–15)
BUN: 26 mg/dL — ABNORMAL HIGH (ref 8–23)
CO2: 25 mmol/L (ref 22–32)
Calcium: 9.2 mg/dL (ref 8.9–10.3)
Chloride: 101 mmol/L (ref 98–111)
Creatinine, Ser: 1.83 mg/dL — ABNORMAL HIGH (ref 0.61–1.24)
GFR, Estimated: 39 mL/min — ABNORMAL LOW (ref >60)
Glucose, Bld: 81 mg/dL (ref 70–99)
Potassium: 4.3 mmol/L (ref 3.5–5.1)
Sodium: 136 mmol/L (ref 135–145)
Total Bilirubin: 0.5 mg/dL (ref <1.2)
Total Protein: 7.2 g/dL (ref 6.5–8.1)

## 2023-08-12 LAB — IRON AND TIBC
Iron: 97 ug/dL (ref 45–182)
Saturation Ratios: 27 % (ref 17.9–39.5)
TIBC: 364 ug/dL (ref 250–450)
UIBC: 267 ug/dL

## 2023-08-12 LAB — FERRITIN: Ferritin: 56 ng/mL (ref 24–336)

## 2023-08-12 LAB — D-DIMER, QUANTITATIVE: D-Dimer, Quant: <0.27 ug{FEU}/mL (ref 0.00–0.50)

## 2023-08-13 ENCOUNTER — Other Ambulatory Visit: Payer: Self-pay

## 2023-08-13 ENCOUNTER — Emergency Department (HOSPITAL_COMMUNITY): Payer: Medicare HMO

## 2023-08-13 ENCOUNTER — Emergency Department (HOSPITAL_COMMUNITY)
Admission: EM | Admit: 2023-08-13 | Discharge: 2023-08-14 | Disposition: A | Discharge status: Home / Self Care | Payer: Medicare HMO | Attending: Student | Admitting: Student

## 2023-08-13 DIAGNOSIS — N182 Chronic kidney disease, stage 2 (mild): Secondary | ICD-10-CM | POA: Diagnosis not present

## 2023-08-13 DIAGNOSIS — I129 Hypertensive chronic kidney disease with stage 1 through stage 4 chronic kidney disease, or unspecified chronic kidney disease: Secondary | ICD-10-CM | POA: Diagnosis not present

## 2023-08-13 DIAGNOSIS — K209 Esophagitis, unspecified without bleeding: Secondary | ICD-10-CM | POA: Diagnosis not present

## 2023-08-13 DIAGNOSIS — Z85828 Personal history of other malignant neoplasm of skin: Secondary | ICD-10-CM | POA: Insufficient documentation

## 2023-08-13 DIAGNOSIS — Z7982 Long term (current) use of aspirin: Secondary | ICD-10-CM | POA: Insufficient documentation

## 2023-08-13 DIAGNOSIS — K449 Diaphragmatic hernia without obstruction or gangrene: Secondary | ICD-10-CM | POA: Insufficient documentation

## 2023-08-13 DIAGNOSIS — K2900 Acute gastritis without bleeding: Secondary | ICD-10-CM | POA: Insufficient documentation

## 2023-08-13 DIAGNOSIS — F17210 Nicotine dependence, cigarettes, uncomplicated: Secondary | ICD-10-CM | POA: Insufficient documentation

## 2023-08-13 DIAGNOSIS — R109 Unspecified abdominal pain: Secondary | ICD-10-CM | POA: Diagnosis present

## 2023-08-13 LAB — BASIC METABOLIC PANEL
Anion gap: 14 (ref 5–15)
BUN: 26 mg/dL — ABNORMAL HIGH (ref 8–23)
CO2: 24 mmol/L (ref 22–32)
Calcium: 9.7 mg/dL (ref 8.9–10.3)
Chloride: 103 mmol/L (ref 98–111)
Creatinine, Ser: 1.99 mg/dL — ABNORMAL HIGH (ref 0.61–1.24)
GFR, Estimated: 35 mL/min — ABNORMAL LOW (ref >60)
Glucose, Bld: 163 mg/dL — ABNORMAL HIGH (ref 70–99)
Potassium: 4.2 mmol/L (ref 3.5–5.1)
Sodium: 141 mmol/L (ref 135–145)

## 2023-08-13 LAB — CBC
HCT: 46.3 % (ref 39.0–52.0)
Hemoglobin: 14.9 g/dL (ref 13.0–17.0)
MCH: 31.5 pg (ref 26.0–34.0)
MCHC: 32.2 g/dL (ref 30.0–36.0)
MCV: 97.9 fL (ref 80.0–100.0)
Platelets: 207 10*3/uL (ref 150–400)
RBC: 4.73 MIL/uL (ref 4.22–5.81)
RDW: 13.3 % (ref 11.5–15.5)
WBC: 9.8 10*3/uL (ref 4.0–10.5)
nRBC: 0 % (ref 0.0–0.2)

## 2023-08-13 LAB — TROPONIN I (HIGH SENSITIVITY): Troponin I (High Sensitivity): 6 ng/L (ref <18)

## 2023-08-13 MED ORDER — FAMOTIDINE IN NACL 20-0.9 MG/50ML-% IV SOLN
20.0000 mg | Freq: Once | INTRAVENOUS | Status: AC
Start: 2023-08-14 — End: 2023-08-14
  Administered 2023-08-14: 20 mg via INTRAVENOUS
  Filled 2023-08-13: qty 50

## 2023-08-13 MED ORDER — LIDOCAINE VISCOUS HCL 2 % MT SOLN
15.0000 mL | Freq: Once | OROMUCOSAL | Status: AC
  Administered 2023-08-14: 15 mL via ORAL
  Filled 2023-08-13: qty 15

## 2023-08-13 MED ORDER — ALUM & MAG HYDROXIDE-SIMETH 200-200-20 MG/5ML PO SUSP
30.0000 mL | Freq: Once | ORAL | Status: AC
  Administered 2023-08-14: 30 mL via ORAL
  Filled 2023-08-13: qty 30

## 2023-08-13 NOTE — ED Triage Notes (Signed)
Pt c/o centralized "stabbing" chest pain that radiates down abdomin. Endorses SOB, N/V, lightheadedness and diaphoresis. States that the pain is worse when sitting.

## 2023-08-13 NOTE — ED Notes (Signed)
Pt states he feels like he is going to pass out when standing.

## 2023-08-13 NOTE — ED Notes (Signed)
Patient transported to X-ray via wheelchair 

## 2023-08-14 LAB — TROPONIN I (HIGH SENSITIVITY): Troponin I (High Sensitivity): 6 ng/L (ref <18)

## 2023-08-14 MED ORDER — MAALOX MAX 400-400-40 MG/5ML PO SUSP: 10.0000 mL | Freq: Four times a day (QID) | ORAL | 0 refills | Status: DC | PRN

## 2023-08-14 MED ORDER — FAMOTIDINE 20 MG PO TABS: 20.0000 mg | ORAL_TABLET | Freq: Two times a day (BID) | ORAL | 0 refills | Status: DC | PRN

## 2023-08-14 NOTE — ED Provider Notes (Signed)
[place] EMERGENCY DEPARTMENT AT [facility] Provider Note  CSN: [id number] Arrival date & time: 08/13/23 2110  Chief Complaint(s) Chest Pain  HPI Jeremiah Calhoun is a 72 y.o. male with PMH nephrolithiasis, hiatal hernia, HTN, HLD, splenic infarct who presents Emergency Department for evaluation of chest and abdominal pain.  States that pain began abruptly and woke him from sleep.  States pain is primarily subxiphoid along radiates up into the chest.  Was associated with nausea, diaphoresis.  Not exertional and worse when lying flat and sitting.  Patient states that pain is approximately 9 out of 10.   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 Authorizing Provider  alum & mag hydroxide-simeth (MAALOX MAX) 400-400-40 MG/5ML suspension Take 10 mLs by mouth every 6 (six) hours as needed for indigestion. 08/14/23  Yes Kennley Schwandt, MD  famotidine (PEPCID) 20 MG tablet Take 1 tablet (20 mg total) by mouth 2 (two) times daily as needed for heartburn or indigestion. 08/14/23  Yes Nathian Stencil, MD  acetaminophen (TYLENOL) 650 MG CR tablet Take 1,300 mg by mouth every 8 (eight) hours as needed for pain.    [provider]  allopurinol (ZYLOPRIM) 100 MG tablet Take 200 mg by mouth daily. 12/11/20   [provider]  amLODipine (NORVASC) 5 MG tablet Take 5 mg  by mouth daily. 12/03/20   [provider]  Ascorbic Acid (VITAMIN C) 1000 MG tablet Take 1,000 mg by mouth daily.    [provider]  aspirin 81 MG tablet Take 81 mg by mouth daily.    [provider]  Cholecalciferol (VITAMIN D3) 125 MCG (5000 UT) CAPS Take 5,000 Units by mouth daily.    [provider]  Coenzyme Q10 (CO Q 10) 100 MG CAPS Take 200 mg by mouth daily.    [provider]  Cyanocobalamin (VITAMIN B-12) 5000 MCG TBDP Take 5,000 mcg by mouth daily.    [provider]  ferrous sulfate 325 (65 FE) MG tablet Take 325 mg by mouth daily with breakfast.    [provider]  HYDROcodone-acetaminophen (NORCO/VICODIN) 5-325 MG tablet Take 1 tablet by mouth every 4 (four) hours as needed. 11/05/22   Jacalyn Lefevre, MD  leflunomide (ARAVA) 20 MG tablet Take 20 mg by mouth daily. 12/02/20   [provider]  lisinopril-hydrochlorothiazide (PRINZIDE,ZESTORETIC) 20-12.5 MG tablet TAKE 2 TABLETS BY MOUTH ONCE DAILY. Patient taking differently: Take 2 tablets by mouth daily. 07/05/16   Jodelle Gross, NP  metoprolol succinate (TOPROL-XL) 100 MG 24 hr tablet TAKE ONE TABLET BY MOUTH DAILY. TAKE WITH OR IMMEDIATELY FOLLOWING A MEAL. Patient taking differently: Take 100 mg by mouth daily. 05/02/16   Jodelle Gross, NP  Multiple Vitamins-Minerals (MULTIVITAMIN WITH MINERALS) tablet Take 1 tablet by  mouth daily. Kidney essential    [provider]  Omega-3 Fatty Acids (FISH OIL PO) Take 1,400 mg by mouth daily.    [provider]  ondansetron (ZOFRAN-ODT) 4 MG disintegrating tablet Take 1 tablet (4 mg total) by mouth every 8 (eight) hours as needed. 11/05/22   Jacalyn Lefevre, MD  OVER THE COUNTER MEDICATION Take 2 tablets by mouth daily. Super Bate Prostate    [provider]  pantoprazole (PROTONIX) 40 MG tablet Take 1 tablet (40 mg total) by mouth 2 (two) times daily. 04/20/14   Butch Penny, MD   rosuvastatin (CRESTOR) 10 MG tablet Take 10 mg by mouth at bedtime.    [provider]  spironolactone (ALDACTONE) 25 MG tablet TAKE ONE TABLET BY MOUTH DAILY. Patient taking differently: Take 25 mg by mouth daily. 04/25/16   Laqueta Linden, MD  tiZANidine (ZANAFLEX) 2 MG tablet Take 2 mg by mouth every 8 (eight) hours as needed for muscle spasms. 10/04/20   [provider]  zinc gluconate 50 MG tablet Take 50 mg by mouth daily.    [provider]                                                                                                                                    Past Surgical History Past Surgical History:  Procedure Laterality Date   COLONOSCOPY N/A 04/19/2014   [id number]:[id number] hemorrhoids. Colonic diverticulosis. Single colonic polyp removed as described above   COLONOSCOPY WITH PROPOFOL N/A 05/29/2021   Procedure: COLONOSCOPY WITH PROPOFOL;  Surgeon: Corbin Ade, MD;  Location: [facility];  Service: Endoscopy;  Laterality: N/A;  10:00am   ESOPHAGOGASTRODUODENOSCOPY N/A 04/19/2014   GNF:[id number] reflux esophagitis - benign-appearing peptic stricture  -  status post dilation as described above. 5 cm hiatal hernia. Gastric erosions.    Status post Elease Hashimoto dilation and gastric biopsy   ESOPHAGOGASTRODUODENOSCOPY (EGD) WITH PROPOFOL N/A 05/29/2021   Procedure: ESOPHAGOGASTRODUODENOSCOPY (EGD) WITH PROPOFOL;  Surgeon: Corbin Ade, MD;  Location: [facility] SU[facility];  Service: Endoscopy;  Laterality: N/A;   HERNIA REPAIR     X2   POLYPECTOMY  05/29/2021   Procedure: POLYPECTOMY;  Surgeon: Corbin Ade, MD;  Location: [facility];  Service: Endoscopy;;   Family History Family History  Problem Relation Age of Onset   Heart attack Mother 22       Cause of death   Heart attack Father 11       Cause of death   Hypertension Sister    Breast cancer Sister    Skin cancer Paternal Aunt    Colon cancer Neg Hx     Social History Social History    Tobacco Use   Smoking status: Every Day    Current packs/day: 0.00    Average packs/day: 0.8 packs/day for 15.0 years (12.0 ttl pk-yrs)    Types: Cigarettes    Start date:  10/01/1998    Last attempt to quit: 10/01/2013    Years since quitting: 9.8   Smokeless tobacco: Never  Vaping Use   Vaping status: Never Used  Substance Use Topics   Alcohol use: Yes    Alcohol/week: 0.0 standard drinks of alcohol    Comment: Rare   Drug use: No   Allergies Codeine  Review of Systems Review of Systems  Cardiovascular:  Positive for chest pain.  Gastrointestinal:  Positive for abdominal pain.    Physical Exam Vital Signs  I have reviewed the triage vital signs BP 122/71   Pulse (!) 103   Temp 98.5 F (36.9 C)   Resp 16   Ht 5\' 9"  (1.753 m)   Wt 83 kg   SpO2 93%   BMI 27.02 kg/m   Physical Exam Constitutional:      General: He is not in acute distress.    Appearance: Normal appearance.  HENT:     Head: Normocephalic and atraumatic.     Nose: No congestion or rhinorrhea.  Eyes:     General:        Right eye: No discharge.        Left eye: No discharge.     Extraocular Movements: Extraocular movements intact.     Pupils: Pupils are equal, round, and reactive to light.  Cardiovascular:     Rate and Rhythm: Normal rate and regular rhythm.     Heart sounds: No murmur heard. Pulmonary:     Effort: No respiratory distress.     Breath sounds: No wheezing or rales.  Abdominal:     General: There is no distension.     Tenderness: There is abdominal tenderness.  Musculoskeletal:        General: Normal range of motion.     Cervical back: Normal range of motion.  Skin:    General: Skin is warm and dry.  Neurological:     General: No focal deficit present.     Mental Status: He is alert.     ED Results and Treatments Labs (all labs ordered are listed, but only abnormal results are displayed) Labs Reviewed  BASIC METABOLIC PANEL - Abnormal; Notable for the following  components:      Result Value   Glucose, Bld 163 (*)    BUN 26 (*)    Creatinine, Ser 1.99 (*)    GFR, Estimated 35 (*)    All other components within normal limits  CBC  TROPONIN I (HIGH SENSITIVITY)  TROPONIN I (HIGH SENSITIVITY)                                                                                                                          Radiology DG Chest 2 View  Result Date: 08/13/2023 CLINICAL DATA:  Chest pain EXAM: CHEST - 2 VIEW COMPARISON:  11/05/2022 FINDINGS: The heart size and mediastinal contours are within normal limits. Both lungs are clear. Moderate hiatal hernia IMPRESSION: No active cardiopulmonary  disease. Moderate hiatal hernia. Electronically Signed   By: Jasmine Pang M.D.   On: 08/13/2023 23:56    Pertinent labs & imaging results that were available during my care of the patient were reviewed by me and considered in my medical decision making (see MDM for details).  Medications Ordered in ED Medications  alum & mag hydroxide-simeth (MAALOX/MYLANTA) 200-200-20 MG/5ML suspension 30 mL (30 mLs Oral Given 08/14/23 0006)    And  lidocaine (XYLOCAINE) 2 % viscous mouth solution 15 mL (15 mLs Oral Given 08/14/23 0006)  famotidine (PEPCID) IVPB 20 mg premix (0 mg Intravenous Stopped 08/14/23 0038)                                                                                                                                     Procedures Procedures  (including critical care time)  Medical Decision Making / ED Course   This patient presents to the ED for concern of chest pain, this involves an extensive number of treatment options, and is a complaint that carries with it a high risk of complications and morbidity.  The differential diagnosis includes ACS, Aortic Dissection, Pneumothorax, Pneumonia, Esophageal Rupture, PE, Tamponade/Pericardial Effusion, pericarditis, esophageal spasm, dysrhythmia, GERD, costochondritis.  MDM: Patient seen emergency room  for evaluation of chest pain and abdominal pain.  Physical exam with reproducible tenderness in the epigastrium but is otherwise unremarkable.  Cardiopulmonary exam unremarkable.  Laboratory evaluation with a BUN of 26, creatinine 1.99 which is a mild elevation from baseline.  ECG nonischemic.  High-sensitivity troponin and delta troponin negative.  Chest x-ray shows a moderate hiatal hernia but is otherwise unremarkable.  No mediastinal widening.  Patient had a similar presentation in February of this year where a dissection scan was ordered that was reassuringly negative but does show a large hiatal hernia.  Patient's pain is primarily epigastric and radiates up into the chest and thus we trialed a GI cocktail.  After GI cocktail and Pepcid, his pain is completely resolved.  He states that he feels "great" and is requesting to be discharged.  With no associated neurosymptoms in the setting of his chest/abdominal pain, as well as no associated hypertension, I have lower suspicion for dissection.  With complete resolution after GI cocktail and pain primarily in the epigastrium, nonischemic ECG and negative troponin testing I have lower suspicion for ACS.  Patient low risk by Wells criteria and I have low suspicion for PE.  Suspect patient symptoms secondary to his large hiatal hernia and he was given Maalox and Pepcid for home as needed use and instructed to continue his PPI and follow-up with his gastroenterologist Dr. Jena Gauss.  He was given strict return precautions of which he voiced understanding and at this time does not meet inpatient criteria for admission.  He was then discharged with outpatient follow-up.   Additional history obtained: -Additional history obtained from wife -External records from outside source obtained  and reviewed including: Chart review including previous notes, labs, imaging, consultation notes   Lab Tests: -I ordered, reviewed, and interpreted labs.   The pertinent results  include:   Labs Reviewed  BASIC METABOLIC PANEL - Abnormal; Notable for the following components:      Result Value   Glucose, Bld 163 (*)    BUN 26 (*)    Creatinine, Ser 1.99 (*)    GFR, Estimated 35 (*)    All other components within normal limits  CBC  TROPONIN I (HIGH SENSITIVITY)  TROPONIN I (HIGH SENSITIVITY)      EKG   EKG Interpretation Date/Time:  Tuesday August 13 2023 21:27:05 EST Ventricular Rate:  96 PR Interval:  202 QRS Duration:  84 QT Interval:  348 QTC Calculation: 439 R Axis:   -36  Text Interpretation: Normal sinus rhythm Left axis deviation No significant change since last tracing Abnormal ECG When compared with ECG of 05-Nov-2022 20:47, PREVIOUS ECG IS PRESENT Confirmed by Gilliam Hawkes ([id number]) on 08/13/2023 11:04:30 PM         Imaging Studies ordered: I ordered imaging studies including chest x-ray I independently visualized and interpreted imaging. I agree with the radiologist interpretation   Medicines ordered and prescription drug management: Meds ordered this encounter  Medications   AND Linked Order Group    alum & mag hydroxide-simeth (MAALOX/MYLANTA) 200-200-20 MG/5ML suspension 30 mL    lidocaine (XYLOCAINE) 2 % viscous mouth solution 15 mL   famotidine (PEPCID) IVPB 20 mg premix   alum & mag hydroxide-simeth (MAALOX MAX) 400-400-40 MG/5ML suspension    Sig: Take 10 mLs by mouth every 6 (six) hours as needed for indigestion.    Dispense:  355 mL    Refill:  0   famotidine (PEPCID) 20 MG tablet    Sig: Take 1 tablet (20 mg total) by mouth 2 (two) times daily as needed for heartburn or indigestion.    Dispense:  30 tablet    Refill:  0    -I have reviewed the patients home medicines and have made adjustments as needed  Critical interventions none   Cardiac Monitoring: The patient was maintained on a cardiac monitor.  I personally viewed and interpreted the cardiac monitored which showed an underlying rhythm of: NSR  Social  Determinants of Health:  Factors impacting patients care include: none   Reevaluation: After the interventions noted above, I reevaluated the patient and found that they have :improved  Co morbidities that complicate the patient evaluation  Past Medical History:  Diagnosis Date   Chest pain, exertional    Chronic kidney disease    kidney stones   Hiatal hernia    Hyperlipidemia    Hypertension    Skin cancer    Splenic infarct       Dispostion: I considered admission for this patient, but at this time he does not meet inpatient criteria for admission and was discharged with outpatient follow-up. strict return precautions given     Final Clinical Impression(s) / ED Diagnoses Final diagnoses:  Acute gastritis without hemorrhage, unspecified gastritis type  Esophagitis     @PCDICTATION @    Glendora Score, MD 08/14/23 [phone]

## 2023-08-16 ENCOUNTER — Inpatient Hospital Stay: Payer: Medicare HMO | Admitting: Oncology

## 2023-08-16 VITALS — BP 116/78 | HR 75 | Temp 98.6°F | Resp 18 | Wt 192.7 lb

## 2023-08-16 DIAGNOSIS — D509 Iron deficiency anemia, unspecified: Secondary | ICD-10-CM

## 2023-08-16 DIAGNOSIS — D696 Thrombocytopenia, unspecified: Secondary | ICD-10-CM | POA: Diagnosis not present

## 2023-08-16 DIAGNOSIS — D72819 Decreased white blood cell count, unspecified: Secondary | ICD-10-CM | POA: Diagnosis not present

## 2023-08-16 DIAGNOSIS — D735 Infarction of spleen: Secondary | ICD-10-CM | POA: Diagnosis not present

## 2023-08-16 DIAGNOSIS — Z87891 Personal history of nicotine dependence: Secondary | ICD-10-CM

## 2023-08-16 NOTE — Progress Notes (Signed)
[facility] [address], [place] [address]   CLINIC:  Medical Oncology/Hematology  PCP:  Marylynn Pearson, FNP [address] [place] [address] [phone]   REASON FOR VISIT:  Follow-up for splenic infarct  CURRENT THERAPY: Observation  INTERVAL HISTORY:  [name] 72 y.o. male returns for routine follow-up of splenic infarcts.  He was last seen by Rojelio Brenner PA-C on 08/16/2022.  Since his last visit he was seen in the emergency room for chest pain on 11/05/2022 and again on 08/13/2023 which they felt was related to large hiatal hernia.  He was instructed to follow-up with surgery and gastroenterology.    Today, he reports feeling pretty good. He has not had any further issues with abdominal pain.  No signs or symptoms of DVT or PE.  He is not currently on any anticoagulation, but is on aspirin 81 mg daily.  No B symptoms such as fever, chills, night sweats, unintentional weight loss.  No changes in his breathing, bowel, or urinary habits.  No bleeding episodes such as hematemesis, hematochezia, melena, or epistaxis.  He denies any fatigue, chest pain, dyspnea on exertion, lightheadedness, or syncope.  He continues to follow-up with gastroenterology for his history of hiatal hernia, gastritis, and bleeding Cameron lesions.  He continues to take daily ferrous sulfate supplementation.  Reports he was recently started on Cosentyx 150 mg for arthritis along with intermittent use of Tylenol.  Does not report significant improvement.   He has occasional cough and shortness of breath.  Has increased urinary frequency although this is chronic for him.  Appetite and energy levels are 100%.  Weight is stable.   REVIEW OF SYSTEMS:  Review of Systems  Respiratory:  Positive for cough and shortness of breath.   Genitourinary:  Positive for frequency.       PAST MEDICAL/SURGICAL HISTORY:  Past Medical History:  Diagnosis Date   Chest pain,  exertional    Chronic kidney disease    kidney stones   Hiatal hernia    Hyperlipidemia    Hypertension    Skin cancer    Splenic infarct    Past Surgical History:  Procedure Laterality Date   COLONOSCOPY N/A 04/19/2014   [id number] hemorrhoids. Colonic diverticulosis. Single colonic polyp removed as described above   COLONOSCOPY WITH PROPOFOL N/A 05/29/2021   Procedure: COLONOSCOPY WITH PROPOFOL;  Surgeon: Corbin Ade, MD;  Location: [facility];  Service: Endoscopy;  Laterality: N/A;  10:00am   ESOPHAGOGASTRODUODENOSCOPY N/A 04/19/2014   [id number] reflux esophagitis - benign-appearing peptic stricture  -  status post dilation as described above. 5 cm hiatal hernia. Gastric erosions.    Status post Elease Hashimoto dilation and gastric biopsy   ESOPHAGOGASTRODUODENOSCOPY (EGD) WITH PROPOFOL N/A 05/29/2021   Procedure: ESOPHAGOGASTRODUODENOSCOPY (EGD) WITH PROPOFOL;  Surgeon: Corbin Ade, MD;  Location: [facility];  Service: Endoscopy;  Laterality: N/A;   HERNIA REPAIR     X2   POLYPECTOMY  05/29/2021   Procedure: POLYPECTOMY;  Surgeon: Corbin Ade, MD;  Location: [facility];  Service: Endoscopy;;     SOCIAL HISTORY:  Social History   Socioeconomic History   Marital status: Divorced    Spouse name: Not on file   Number of children: Not on file   Years of education: Not on file   Highest education level: Not on file  Occupational History   Not on file  Tobacco Use   Smoking status: Every Day  Current packs/day: 0.00    Average packs/day: 0.8 packs/day for 15.0 years (12.0 ttl pk-yrs)    Types: Cigarettes    Start date: 10/01/1998    Last attempt to quit: 10/01/2013    Years since quitting: 9.8   Smokeless tobacco: Never  Vaping Use   Vaping status: Never Used  Substance and Sexual Activity   Alcohol use: Yes    Alcohol/week: 0.0 standard drinks of alcohol    Comment: Rare   Drug use: No   Sexual activity: Not on file  Other Topics Concern   Not on file   Social History Narrative   Not on file   Social Determinants of Health   Financial Resource Strain: Not on file  Food Insecurity: Not on file  Transportation Needs: Not on file  Physical Activity: Not on file  Stress: Not on file  Social Connections: Not on file  Intimate Partner Violence: Not At Risk (12/30/2020)   Humiliation, Afraid, Rape, and Kick questionnaire    Fear of Current or Ex-Partner: No    Emotionally Abused: No    Physically Abused: No    Sexually Abused: No    FAMILY HISTORY:  Family History  Problem Relation Age of Onset   Heart attack Mother 66       Cause of death   Heart attack Father 95       Cause of death   Hypertension Sister    Breast cancer Sister    Skin cancer Paternal Aunt    Colon cancer Neg Hx     CURRENT MEDICATIONS:  Outpatient Encounter Medications as of 08/16/2023  Medication Sig   acetaminophen (TYLENOL) 650 MG CR tablet Take 1,300 mg by mouth every 8 (eight) hours as needed for pain.   allopurinol (ZYLOPRIM) 100 MG tablet Take 200 mg by mouth daily.   alum & mag hydroxide-simeth (MAALOX MAX) 400-400-40 MG/5ML suspension Take 10 mLs by mouth every 6 (six) hours as needed for indigestion.   amLODipine (NORVASC) 5 MG tablet Take 5 mg by mouth daily.   Ascorbic Acid (VITAMIN C) 1000 MG tablet Take 1,000 mg by mouth daily.   aspirin 81 MG tablet Take 81 mg by mouth daily.   Cholecalciferol (VITAMIN D3) 125 MCG (5000 UT) CAPS Take 5,000 Units by mouth daily.   Coenzyme Q10 (CO Q 10) 100 MG CAPS Take 200 mg by mouth daily.   Cyanocobalamin (VITAMIN B-12) 5000 MCG TBDP Take 5,000 mcg by mouth daily.   famotidine (PEPCID) 20 MG tablet Take 1 tablet (20 mg total) by mouth 2 (two) times daily as needed for heartburn or indigestion.   ferrous sulfate 325 (65 FE) MG tablet Take 325 mg by mouth daily with breakfast.   HYDROcodone-acetaminophen (NORCO/VICODIN) 5-325 MG tablet Take 1 tablet by mouth every 4 (four) hours as needed.   leflunomide  (ARAVA) 20 MG tablet Take 20 mg by mouth daily.   lisinopril-hydrochlorothiazide (PRINZIDE,ZESTORETIC) 20-12.5 MG tablet TAKE 2 TABLETS BY MOUTH ONCE DAILY. (Patient taking differently: Take 2 tablets by mouth daily.)   metoprolol succinate (TOPROL-XL) 100 MG 24 hr tablet TAKE ONE TABLET BY MOUTH DAILY. TAKE WITH OR IMMEDIATELY FOLLOWING A MEAL. (Patient taking differently: Take 100 mg by mouth daily.)   Multiple Vitamins-Minerals (MULTIVITAMIN WITH MINERALS) tablet Take 1 tablet by mouth daily. Kidney essential   Omega-3 Fatty Acids (FISH OIL PO) Take 1,400 mg by mouth daily.   ondansetron (ZOFRAN-ODT) 4 MG disintegrating tablet Take 1 tablet (4 mg total) by mouth  every 8 (eight) hours as needed.   OVER THE COUNTER MEDICATION Take 2 tablets by mouth daily. Super Gaston Islam Prostate   pantoprazole (PROTONIX) 40 MG tablet Take 1 tablet (40 mg total) by mouth 2 (two) times daily.   rosuvastatin (CRESTOR) 10 MG tablet Take 10 mg by mouth at bedtime.   spironolactone (ALDACTONE) 25 MG tablet TAKE ONE TABLET BY MOUTH DAILY. (Patient taking differently: Take 25 mg by mouth daily.)   tiZANidine (ZANAFLEX) 2 MG tablet Take 2 mg by mouth every 8 (eight) hours as needed for muscle spasms.   zinc gluconate 50 MG tablet Take 50 mg by mouth daily.   No facility-administered encounter medications on file as of 08/16/2023.    ALLERGIES:  Allergies  Allergen Reactions   Codeine Nausea And Vomiting     PHYSICAL EXAM:  ECOG PERFORMANCE STATUS: 0 - Asymptomatic  There were no vitals filed for this visit. There were no vitals filed for this visit. Physical Exam Constitutional:      Appearance: Normal appearance.  Cardiovascular:     Rate and Rhythm: Normal rate and regular rhythm.  Pulmonary:     Effort: Pulmonary effort is normal.     Breath sounds: Normal breath sounds.  Abdominal:     General: Bowel sounds are normal.     Palpations: Abdomen is soft.  Musculoskeletal:        General: No swelling.  Normal range of motion.  Neurological:     Mental Status: He is alert and oriented to person, place, and time. Mental status is at baseline.      LABORATORY DATA:  I have reviewed the labs as listed.  CBC    Component Value Date/Time   WBC 9.8 08/13/2023 2153   RBC 4.73 08/13/2023 2153   HGB 14.9 08/13/2023 2153   HCT 46.3 08/13/2023 2153   PLT 207 08/13/2023 2153   MCV 97.9 08/13/2023 2153   MCH 31.5 08/13/2023 2153   MCHC 32.2 08/13/2023 2153   RDW 13.3 08/13/2023 2153   LYMPHSABS 1.5 08/12/2023 1021   MONOABS 0.7 08/12/2023 1021   EOSABS 0.3 08/12/2023 1021   BASOSABS 0.1 08/12/2023 1021      Latest Ref Rng & Units 08/13/2023    9:53 PM 08/12/2023   10:28 AM 11/05/2022    9:05 PM  CMP  Glucose 70 - 99 mg/dL 253  81  664   BUN 8 - 23 mg/dL 26  26  28    Creatinine 0.61 - 1.24 mg/dL 4.03  4.74  2.59   Sodium 135 - 145 mmol/L 141  136  138   Potassium 3.5 - 5.1 mmol/L 4.2  4.3  3.7   Chloride 98 - 111 mmol/L 103  101  103   CO2 22 - 32 mmol/L 24  25  21    Calcium 8.9 - 10.3 mg/dL 9.7  9.2  9.7   Total Protein 6.5 - 8.1 g/dL  7.2  7.2   Total Bilirubin <1.2 mg/dL  0.5  1.1   Alkaline Phos 38 - 126 U/L  107  97   AST 15 - 41 U/L  32  31   ALT 0 - 44 U/L  37  39     DIAGNOSTIC IMAGING:  I have independently reviewed the relevant imaging and discussed with the patient.  ASSESSMENT : 1.  Cryptogenic splenic infarct - Presented to ED on 12/19/2020 with severe abdominal pain - CT abdomen/pelvis (12/19/2020): Splenic infarct along anterior spleen with other areas  of low-attenuation potentially small infarcts as well; no perisplenic stranding - No clear reason for splenic infarct such as abdominal trauma, history of splenic infection, history of blood clots, or family history of hypercoagulable disorders.  No known history of sickle cell disease. - Hypercoagulable work-up showed no evidence of coagulopathy (PNH profile negative, protein S activity normal, protein C activity  normal, Antithrombin III normal, cardiolipin antibodies negative, beta-2 glycoprotein and bodies negative, lupus anticoagulant panel negative, factor V Leiden negative, - Per note by Dr. Al Pimple (01/27/2021), it is unclear if splenic infarct is incidental finding versus actual cause of patient's abdominal pain - Since patient had no other episodes, did not have any continued abdominal pain, or evidence of coagulopathy, Dr. Al Pimple did not recommend any anticoagulation, but recommended continued follow-up with GI team  2.  History of iron deficiency anemia from GI bleeding - History of GI bleed in 2015, required 4 units PRBC transfusions during that time - Most recent EGD/colonoscopy (05/29/2021) revealed large hiatal hernia and gastric erosions consistent with Sheria Lang lesions; polyps, diverticulosis, and nonbleeding internal hemorrhoids and colon  3.  Leukopenia and thrombocytopenia - Incidental/isolated finding on CBC from 08/09/2022 with WBC 3.5, platelets 124, normal differential - CT abdomen/pelvis (12/19/2020) was negative for any obvious liver disease.  Patient had areas of splenic infarct on anterior spleen, but no splenomegaly noted per radiologist  4.  Tobacco use - Smokes 0.5 - 0.75 PPD cigarettes x 15 years   5.  Other history - Tobacco use as above.  No alcohol intake. - History of GI bleed, hypertension, questionable silent MI  PLAN: 1. Splenic infarct - No current abdominal pain/nausea/vomiting.  No symptoms concerning for DVT/PE. - Most recent labs (08/12/23): Normal D-dimer (0.27), normal Hgb 15.0 - Per evaluation by Dr. Al Pimple in April 2022, patient does not have strong indication for anticoagulation at that this time, especially since he has a history of GI bleeding.  However, if he has any recurrent splenic infarcts or new DVT/PE in the future, he would likely benefit from lifelong anticoagulation at that time. - Continue aspirin 81 mg daily  2. Iron deficiency anemia, unspecified  iron deficiency anemia type - No obvious source of bleeding such as hematemesis, hematochezia, or melena - Most recent labs (08/12/23): Hgb 15.0/MCV 47.2 ferritin 56, iron saturation 27%. - Continue daily iron supplement.  Continue follow-up with GI.    3. Thrombocytopenia (HCC) -Resolved. -Platelet count 195 and normal the past 3 lab draws.  4. Leukopenia, unspecified type -Resolved. -White count 5.6 on has been normal of the past 3 lab draws.  5. Personal history of tobacco use, presenting hazards to health - Patient does not currently meet criteria for LDCT lung cancer screening program.,  But we have counseled on the importance of smoking cessation.  PLAN SUMMARY: >> Return to clinic in 1 year for follow-up with labs a few days before (CBC/D, CMP, D-dimer, ferritin, iron/TIBC) and office visit.      All questions were answered. The patient knows to call the clinic with any problems, questions or concerns.  Medical decision making: Low   Time spent on visit: I spent 25 minutes dedicated to the care of this patient (face-to-face and non-face-to-face) on the date of the encounter to include what is described in the assessment and plan.    Mauro Kaufmann, NP  08/16/2022 10:26 AM

## 2023-09-13 ENCOUNTER — Other Ambulatory Visit (HOSPITAL_COMMUNITY): Payer: Self-pay | Admitting: Family Medicine

## 2023-09-13 DIAGNOSIS — F17210 Nicotine dependence, cigarettes, uncomplicated: Secondary | ICD-10-CM

## 2023-09-29 ENCOUNTER — Ambulatory Visit (HOSPITAL_COMMUNITY)
Admission: RE | Admit: 2023-09-29 | Discharge: 2023-09-29 | Disposition: A | Discharge status: Home / Self Care | Payer: Medicare HMO | Source: Ambulatory Visit | Attending: Family Medicine | Admitting: Family Medicine

## 2023-09-29 DIAGNOSIS — F17210 Nicotine dependence, cigarettes, uncomplicated: Secondary | ICD-10-CM | POA: Insufficient documentation

## 2023-11-01 ENCOUNTER — Encounter (HOSPITAL_COMMUNITY): Payer: Self-pay | Admitting: Emergency Medicine

## 2023-11-01 ENCOUNTER — Telehealth: Payer: Self-pay | Admitting: Gastroenterology

## 2023-11-01 ENCOUNTER — Other Ambulatory Visit: Payer: Self-pay

## 2023-11-01 ENCOUNTER — Emergency Department (HOSPITAL_COMMUNITY): Payer: Medicare HMO

## 2023-11-01 ENCOUNTER — Emergency Department (HOSPITAL_COMMUNITY)
Admission: EM | Admit: 2023-11-01 | Discharge: 2023-11-01 | Disposition: A | Discharge status: Home / Self Care | Payer: Medicare HMO | Attending: Emergency Medicine | Admitting: Emergency Medicine

## 2023-11-01 DIAGNOSIS — N200 Calculus of kidney: Secondary | ICD-10-CM | POA: Insufficient documentation

## 2023-11-01 DIAGNOSIS — N281 Cyst of kidney, acquired: Secondary | ICD-10-CM | POA: Diagnosis not present

## 2023-11-01 DIAGNOSIS — J40 Bronchitis, not specified as acute or chronic: Secondary | ICD-10-CM | POA: Diagnosis not present

## 2023-11-01 DIAGNOSIS — I700 Atherosclerosis of aorta: Secondary | ICD-10-CM | POA: Insufficient documentation

## 2023-11-01 DIAGNOSIS — R0602 Shortness of breath: Secondary | ICD-10-CM | POA: Insufficient documentation

## 2023-11-01 DIAGNOSIS — R079 Chest pain, unspecified: Secondary | ICD-10-CM | POA: Diagnosis present

## 2023-11-01 DIAGNOSIS — Z79899 Other long term (current) drug therapy: Secondary | ICD-10-CM | POA: Insufficient documentation

## 2023-11-01 DIAGNOSIS — K449 Diaphragmatic hernia without obstruction or gangrene: Secondary | ICD-10-CM | POA: Diagnosis not present

## 2023-11-01 DIAGNOSIS — R0789 Other chest pain: Secondary | ICD-10-CM

## 2023-11-01 DIAGNOSIS — I480 Paroxysmal atrial fibrillation: Secondary | ICD-10-CM | POA: Diagnosis not present

## 2023-11-01 DIAGNOSIS — I129 Hypertensive chronic kidney disease with stage 1 through stage 4 chronic kidney disease, or unspecified chronic kidney disease: Secondary | ICD-10-CM | POA: Diagnosis not present

## 2023-11-01 DIAGNOSIS — Z5329 Procedure and treatment not carried out because of patient's decision for other reasons: Secondary | ICD-10-CM | POA: Diagnosis not present

## 2023-11-01 DIAGNOSIS — Z20822 Contact with and (suspected) exposure to covid-19: Secondary | ICD-10-CM | POA: Diagnosis not present

## 2023-11-01 DIAGNOSIS — R112 Nausea with vomiting, unspecified: Secondary | ICD-10-CM

## 2023-11-01 DIAGNOSIS — N400 Enlarged prostate without lower urinary tract symptoms: Secondary | ICD-10-CM | POA: Diagnosis not present

## 2023-11-01 DIAGNOSIS — I4891 Unspecified atrial fibrillation: Secondary | ICD-10-CM

## 2023-11-01 DIAGNOSIS — N189 Chronic kidney disease, unspecified: Secondary | ICD-10-CM | POA: Diagnosis not present

## 2023-11-01 LAB — RESP PANEL BY RT-PCR (RSV, FLU A&B, COVID)  RVPGX2
Influenza A by PCR: NEGATIVE
Influenza B by PCR: NEGATIVE
Resp Syncytial Virus by PCR: NEGATIVE
SARS Coronavirus 2 by RT PCR: NEGATIVE

## 2023-11-01 LAB — CBC WITH DIFFERENTIAL/PLATELET
Abs Immature Granulocytes: 0.02 10*3/uL (ref 0.00–0.07)
Basophils Absolute: 0 10*3/uL (ref 0.0–0.1)
Basophils Relative: 0 %
Eosinophils Absolute: 0 10*3/uL (ref 0.0–0.5)
Eosinophils Relative: 0 %
HCT: 45.5 % (ref 39.0–52.0)
Hemoglobin: 15.6 g/dL (ref 13.0–17.0)
Immature Granulocytes: 0 %
Lymphocytes Relative: 5 %
Lymphs Abs: 0.4 10*3/uL — ABNORMAL LOW (ref 0.7–4.0)
MCH: 32 pg (ref 26.0–34.0)
MCHC: 34.3 g/dL (ref 30.0–36.0)
MCV: 93.4 fL (ref 80.0–100.0)
Monocytes Absolute: 0.6 10*3/uL (ref 0.1–1.0)
Monocytes Relative: 7 %
Neutro Abs: 7.4 10*3/uL (ref 1.7–7.7)
Neutrophils Relative %: 88 %
Platelets: 245 10*3/uL (ref 150–400)
RBC: 4.87 MIL/uL (ref 4.22–5.81)
RDW: 13.9 % (ref 11.5–15.5)
WBC: 8.4 10*3/uL (ref 4.0–10.5)
nRBC: 0 % (ref 0.0–0.2)

## 2023-11-01 LAB — COMPREHENSIVE METABOLIC PANEL
ALT: 27 U/L (ref 0–44)
AST: 30 U/L (ref 15–41)
Albumin: 4.7 g/dL (ref 3.5–5.0)
Alkaline Phosphatase: 107 U/L (ref 38–126)
Anion gap: 21 — ABNORMAL HIGH (ref 5–15)
BUN: 32 mg/dL — ABNORMAL HIGH (ref 8–23)
CO2: 24 mmol/L (ref 22–32)
Calcium: 10.5 mg/dL — ABNORMAL HIGH (ref 8.9–10.3)
Chloride: 97 mmol/L — ABNORMAL LOW (ref 98–111)
Creatinine, Ser: 1.85 mg/dL — ABNORMAL HIGH (ref 0.61–1.24)
GFR, Estimated: 38 mL/min — ABNORMAL LOW (ref >60)
Glucose, Bld: 203 mg/dL — ABNORMAL HIGH (ref 70–99)
Potassium: 4.3 mmol/L (ref 3.5–5.1)
Sodium: 142 mmol/L (ref 135–145)
Total Bilirubin: 1.1 mg/dL (ref 0.0–1.2)
Total Protein: 8.4 g/dL — ABNORMAL HIGH (ref 6.5–8.1)

## 2023-11-01 LAB — LIPASE, BLOOD: Lipase: 78 U/L — ABNORMAL HIGH (ref 11–51)

## 2023-11-01 LAB — TROPONIN I (HIGH SENSITIVITY)
Troponin I (High Sensitivity): 25 ng/L — ABNORMAL HIGH (ref <18)
Troponin I (High Sensitivity): 30 ng/L — ABNORMAL HIGH (ref <18)

## 2023-11-01 LAB — LACTIC ACID, PLASMA: Lactic Acid, Venous: 3.4 mmol/L (ref 0.5–1.9)

## 2023-11-01 MED ORDER — FENTANYL CITRATE PF 50 MCG/ML IJ SOSY
50.0000 ug | PREFILLED_SYRINGE | Freq: Once | INTRAMUSCULAR | Status: AC
  Administered 2023-11-01: 50 ug via INTRAVENOUS
  Filled 2023-11-01: qty 1

## 2023-11-01 MED ORDER — PROMETHAZINE-DM 6.25-15 MG/5ML PO SYRP: 2.5000 mL | ORAL_SOLUTION | Freq: Four times a day (QID) | ORAL | 0 refills | Status: DC | PRN

## 2023-11-01 MED ORDER — LACTATED RINGERS IV BOLUS
1000.0000 mL | Freq: Once | INTRAVENOUS | Status: AC
  Administered 2023-11-01: 1000 mL via INTRAVENOUS

## 2023-11-01 MED ORDER — METOPROLOL TARTRATE 5 MG/5ML IV SOLN
5.0000 mg | Freq: Once | INTRAVENOUS | Status: AC
  Administered 2023-11-01: 5 mg via INTRAVENOUS
  Filled 2023-11-01: qty 5

## 2023-11-01 MED ORDER — METOCLOPRAMIDE HCL 5 MG/ML IJ SOLN
10.0000 mg | Freq: Once | INTRAMUSCULAR | Status: AC
  Administered 2023-11-01: 10 mg via INTRAVENOUS
  Filled 2023-11-01: qty 2

## 2023-11-01 MED ORDER — ALUM & MAG HYDROXIDE-SIMETH 200-200-20 MG/5ML PO SUSP
30.0000 mL | Freq: Once | ORAL | Status: AC
  Administered 2023-11-01: 30 mL via ORAL
  Filled 2023-11-01: qty 30

## 2023-11-01 MED ORDER — MORPHINE SULFATE (PF) 4 MG/ML IV SOLN
4.0000 mg | Freq: Once | INTRAVENOUS | Status: AC
  Administered 2023-11-01: 4 mg via INTRAVENOUS
  Filled 2023-11-01: qty 1

## 2023-11-01 MED ORDER — PANTOPRAZOLE SODIUM 40 MG IV SOLR
40.0000 mg | Freq: Once | INTRAVENOUS | Status: AC
  Administered 2023-11-01: 40 mg via INTRAVENOUS
  Filled 2023-11-01: qty 10

## 2023-11-01 MED ORDER — IOHEXOL 350 MG/ML SOLN
75.0000 mL | Freq: Once | INTRAVENOUS | Status: AC | PRN
  Administered 2023-11-01: 75 mL via INTRAVENOUS

## 2023-11-01 MED ORDER — LIDOCAINE VISCOUS HCL 2 % MT SOLN
15.0000 mL | Freq: Once | OROMUCOSAL | Status: AC
  Administered 2023-11-01: 15 mL via ORAL
  Filled 2023-11-01: qty 15

## 2023-11-01 MED ORDER — ONDANSETRON 4 MG PO TBDP: 4.0000 mg | ORAL_TABLET | Freq: Three times a day (TID) | ORAL | 0 refills | Status: DC | PRN

## 2023-11-01 MED ORDER — ONDANSETRON HCL 4 MG/2ML IJ SOLN
4.0000 mg | Freq: Once | INTRAMUSCULAR | Status: AC
  Administered 2023-11-01: 4 mg via INTRAVENOUS
  Filled 2023-11-01: qty 2

## 2023-11-01 NOTE — ED Triage Notes (Signed)
Pt c/o nausea/ vomiting & chest pain x24 hours. Pt vomits w/ any oral intake. Tachycardiac in triage.

## 2023-11-01 NOTE — Telephone Encounter (Signed)
Please arrange ED follow-up for patient in 1-2 weeks.  Has seen [facility] in the past in 2022.  Is a patient of Dr. Luvenia Starch.  Can be with Dr. Jena Gauss, [facility], and if no availability with any other APP.

## 2023-11-01 NOTE — ED Provider Notes (Signed)
[place] EMERGENCY DEPARTMENT AT [facility] Provider Note   CSN: [id number] Arrival date & time: 11/01/23  4098     History {Add pertinent medical, surgical, social history, OB history to HPI:1} Chief Complaint  Patient presents with   Chest Pain    Jeremiah Calhoun is a 73 y.o. male with PMH as listed below who c/o nausea/ vomiting & chest pain x24 hours. Pt vomits w/ any oral intake. Tachycardic in triage to 129 bpm. Reports acute onset severe nausea/vomiting that started last night. No PO intake, constant vomiting since then. Denies hematemesis. Sharp, stabbing constant central chest pain started at the same time as the vomiting, not after. Has h/o similar several times, states when it has happened they have blamed it in the past on hiatal hernia and other things as well. Pain is not exertional. Pain rated 10/10 right now. Denies EtOH, endorses 1 ppd of tobacco. Denies fevers/chills, flu-like symptoms, diarrhea. Endorses all-over abdominal pain worst in epigastric region which is normal for him d/t hiatal hernia but worse today. No urinary sxs, leg swelling.   Per chart review had similar presentation on 08/14/2023, which had a reassuring workup including chest x-ray and troponin; also had a similar presentation in February 2024, during which time a CT dissection protocol was negative, was treated with GI cocktail and Pepcid and pain completely resolved.  Past Medical History:  Diagnosis Date   Chest pain, exertional    Chronic kidney disease    kidney stones   Hiatal hernia    Hyperlipidemia    Hypertension    Skin cancer    Splenic infarct        Home Medications Prior to Admission medications   Medication Sig Start Date End Date Taking? Authorizing Provider  allopurinol (ZYLOPRIM) 100 MG tablet Take 200 mg by mouth daily. 12/11/20  Yes [provider]  alum & mag hydroxide-simeth (MAALOX MAX) 400-400-40 MG/5ML suspension Take 10 mLs by mouth every 6 (six)  hours as needed for indigestion. 08/14/23  Yes Kommor, Madison, MD  amLODipine (NORVASC) 5 MG tablet Take 5 mg by mouth daily. 12/03/20  Yes [provider]  Ascorbic Acid (VITAMIN C) 1000 MG tablet Take 1,000 mg by mouth daily.   Yes [provider]  aspirin 81 MG tablet Take 81 mg by mouth daily.   Yes [provider]  Cholecalciferol (VITAMIN D3) 125 MCG (5000 UT) CAPS Take 5,000 Units by mouth daily.   Yes [provider]  Coenzyme Q10 (CO Q 10) 100 MG CAPS Take 200 mg by mouth daily.   Yes [provider]  Cyanocobalamin (VITAMIN B-12) 5000 MCG TBDP Take 5,000 mcg by mouth daily.   Yes [provider]  ferrous sulfate 325 (65 FE) MG tablet Take 325 mg by mouth daily with breakfast.   Yes [provider]  leflunomide (ARAVA) 20 MG tablet Take 20 mg by mouth daily. 12/02/20  Yes [provider]  lisinopril-hydrochlorothiazide (PRINZIDE,ZESTORETIC) 20-12.5 MG tablet TAKE 2 TABLETS BY MOUTH ONCE DAILY. Patient taking differently: Take 2 tablets by mouth daily. 07/05/16  Yes Jodelle Gross, NP  metoprolol succinate (TOPROL-XL) 100 MG 24 hr tablet TAKE ONE TABLET BY MOUTH DAILY. TAKE WITH OR IMMEDIATELY FOLLOWING A MEAL. Patient taking differently: Take 100 mg by mouth daily. 05/02/16  Yes Jodelle Gross, NP  Multiple Vitamins-Minerals (MULTIVITAMIN WITH MINERALS) tablet Take 1 tablet by mouth daily. Kidney essential   Yes [provider]  Omega-3 Fatty Acids (  FISH OIL PO) Take 1,400 mg by mouth daily.   Yes [provider]  ondansetron (ZOFRAN-ODT) 4 MG disintegrating tablet Take 1 tablet (4 mg total) by mouth every 8 (eight) hours as needed. 11/05/22  Yes Jacalyn Lefevre, MD  pantoprazole (PROTONIX) 40 MG tablet Take 1 tablet (40 mg total) by mouth 2 (two) times daily. 04/20/14  Yes McInnis, Thalia Party, MD  rosuvastatin (CRESTOR) 10 MG tablet Take 10 mg by mouth at bedtime.   Yes [provider]   spironolactone (ALDACTONE) 25 MG tablet TAKE ONE TABLET BY MOUTH DAILY. Patient taking differently: Take 25 mg by mouth daily. 04/25/16  Yes Laqueta Linden, MD  tiZANidine (ZANAFLEX) 2 MG tablet Take 2 mg by mouth every 8 (eight) hours as needed for muscle spasms. 10/04/20  Yes [provider]  zinc gluconate 50 MG tablet Take 50 mg by mouth daily.   Yes [provider]  acetaminophen (TYLENOL) 650 MG CR tablet Take 1,300 mg by mouth every 8 (eight) hours as needed for pain.    [provider]  famotidine (PEPCID) 20 MG tablet Take 1 tablet (20 mg total) by mouth 2 (two) times daily as needed for heartburn or indigestion. Patient not taking: Reported on 11/01/2023 08/14/23   Kommor, Wyn Forster, MD  HYDROcodone-acetaminophen (NORCO/VICODIN) 5-325 MG tablet Take 1 tablet by mouth every 4 (four) hours as needed. Patient not taking: Reported on 11/01/2023 11/05/22   Jacalyn Lefevre, MD  OVER THE COUNTER MEDICATION Take 2 tablets by mouth daily. Super Bate Prostate    [provider]      Allergies    Codeine    Review of Systems   Review of Systems A 10 point review of systems was performed and is negative unless otherwise reported in HPI.  Physical Exam Updated Vital Signs BP (!) 150/99   Pulse (!) 128   Temp 97.9 F (36.6 C) (Oral)   Resp 15   Ht 5\' 9"  (1.753 m)   Wt 78.5 kg   SpO2 98%   BMI 25.55 kg/m  Physical Exam General: Normal appearing male, lying in bed.  HEENT: PERRLA, Sclera anicteric, MMM, trachea midline.  Cardiology: RRR, no murmurs/rubs/gallops. BL radial and DP pulses equal bilaterally.  Resp: Normal respiratory rate and effort. CTAB, no wheezes, rhonchi, crackles.  Abd: Soft, non-tender, non-distended. No rebound tenderness or guarding.  GU: Deferred. MSK: No peripheral edema or signs of trauma. Extremities without deformity or TTP. No cyanosis or clubbing. Skin: warm, dry. No rashes or lesions. Back: No CVA tenderness Neuro:  A&Ox4, CNs II-XII grossly intact. MAEs. Sensation grossly intact.  Psych: Normal mood and affect.   ED Results / Procedures / Treatments   Labs (all labs ordered are listed, but only abnormal results are displayed) Labs Reviewed  CBC WITH DIFFERENTIAL/PLATELET - Abnormal; Notable for the following components:      Result Value   Lymphs Abs 0.4 (*)    All other components within normal limits  COMPREHENSIVE METABOLIC PANEL - Abnormal; Notable for the following components:   Chloride 97 (*)    Glucose, Bld 203 (*)    BUN 32 (*)    Creatinine, Ser 1.85 (*)    Calcium 10.5 (*)    Total Protein 8.4 (*)    GFR, Estimated 38 (*)    Anion gap 21 (*)    All other components within normal limits  LIPASE, BLOOD - Abnormal; Notable for the following components:   Lipase 78 (*)  All other components within normal limits  LACTIC ACID, PLASMA - Abnormal; Notable for the following components:   Lactic Acid, Venous 3.4 (*)    All other components within normal limits  TROPONIN I (HIGH SENSITIVITY) - Abnormal; Notable for the following components:   Troponin I (High Sensitivity) 25 (*)    All other components within normal limits  TROPONIN I (HIGH SENSITIVITY) - Abnormal; Notable for the following components:   Troponin I (High Sensitivity) 30 (*)    All other components within normal limits  RESP PANEL BY RT-PCR (RSV, FLU A&B, COVID)  [id number]  I-STAT CHEM 8, ED    EKG EKG Interpretation Date/Time:  Friday November 01 2023 06:56:39 EST Ventricular Rate:  129 PR Interval:  168 QRS Duration:  84 QT Interval:  296 QTC Calculation: 433 R Axis:   -16  Text Interpretation: Atrial fibrillation with RVR Inferior infarct (cited on or before 01-Nov-2023) Anterolateral infarct (cited on or before 01-Nov-2023) Reconfirmed by Vivi Barrack [phone]) on 11/01/2023 11:47:30 AM  Radiology CT Angio Chest/Abd/Pel for Dissection W and/or Wo Contrast Result Date: 11/01/2023 CLINICAL DATA:  Acute aortic  syndrome (AAS) suspected. Nausea/vomiting and chest pain for 24 hours. Tachycardia. EXAM: CT ANGIOGRAPHY CHEST, ABDOMEN AND PELVIS TECHNIQUE: Non-contrast CT of the chest was initially obtained. Multidetector CT imaging through the chest, abdomen and pelvis was performed using the standard protocol during bolus administration of intravenous contrast. Multiplanar reconstructed images and MIPs were obtained and reviewed to evaluate the vascular anatomy. RADIATION DOSE REDUCTION: This exam was performed according to the departmental dose-optimization program which includes automated exposure control, adjustment of the mA and/or kV according to patient size and/or use of iterative reconstruction technique. CONTRAST:  75mL OMNIPAQUE IOHEXOL 350 MG/ML SOLN COMPARISON:  CT scan chest from 09/29/2023 and CT angiography chest, abdomen and pelvis from 11/05/2022. FINDINGS: CTA CHEST FINDINGS Cardiovascular: No intramural hematoma noted in the thoracic aorta on the unenhanced images. Thoracic aorta is normal in caliber without aneurysm, dissection, vasculitis or significant stenosis. Normal cardiac size. No pericardial effusion. No aortic aneurysm. There are coronary artery calcifications, in keeping with coronary artery disease. There are also mild peripheral atherosclerotic vascular calcifications of thoracic aorta and its major branches. Mediastinum/Nodes: Visualized thyroid gland appears grossly unremarkable. No solid / cystic mediastinal masses. The entire esophagus is dilated and fluid-filled, which is nonspecific but most likely seen in the settings of chronic gastroesophageal reflux disease versus esophageal dysmotility. No axillary, mediastinal or hilar lymphadenopathy by size criteria. Lungs/Pleura: The central tracheo-bronchial tree is patent.No mass or consolidation. No pleural effusion or pneumothorax. No suspicious lung nodules. Musculoskeletal: Bilateral mild-to-moderate symmetric gynecomastia noted. The  visualized soft tissues of the chest wall are otherwise grossly unremarkable. No suspicious osseous lesions. There are mild to moderate multilevel degenerative changes in the visualized spine. Review of the MIP images confirms the above findings. CTA ABDOMEN AND PELVIS FINDINGS VASCULAR Aorta: Normal caliber aorta without aneurysm, dissection, vasculitis or significant stenosis. Celiac: Separate origin of left gastric artery, which is otherwise unremarkable. The celiac trunk (splenic and common hepatic arteries) is also patent without evidence of aneurysm, dissection, vasculitis or significant stenosis. SMA: Patent without evidence of aneurysm, dissection, vasculitis or significant stenosis. Renals: Both renal arteries are patent without evidence of aneurysm, dissection, vasculitis, fibromuscular dysplasia or significant stenosis. IMA: Patent without evidence of aneurysm, dissection, vasculitis or significant stenosis. Inflow: Patent without evidence of aneurysm, dissection, vasculitis or significant stenosis. Veins: No obvious venous abnormality within the limitations of this arterial phase  study. Review of the MIP images confirms the above findings. NON-VASCULAR Hepatobiliary: The liver is normal in size. Non-cirrhotic configuration. No suspicious mass. No intrahepatic or extrahepatic bile duct dilation. No calcified gallstones. Normal gallbladder wall thickness. No pericholecystic inflammatory changes. Pancreas: Unremarkable. No pancreatic ductal dilatation or surrounding inflammatory changes. Spleen: Within normal limits. No focal lesion. Adrenals/Urinary Tract: Stable bilateral adrenal glands. No suspicious renal mass. Mild-to-moderate diffuse cortical atrophy of left kidney noted. There are multiple simple cysts throughout bilateral kidneys with largest arising from the right kidney lower pole measuring up to 3.4 x 4.0 cm. There are at least 3-4 nonobstructing calculi in the right kidney with largest in the  upper pole measuring up to 4 x 8 mm. There is a single 1 mm nonobstructing calculus in the left kidney interpolar region. No ureterolithiasis or obstructive uropathy on either side. Unremarkable urinary bladder. Stomach/Bowel: Redemonstration of moderate hiatal hernia containing portion of the stomach, essentially similar to the prior study. No disproportionate dilation of the small or large bowel loops. No evidence of abnormal bowel wall thickening or inflammatory changes. The appendix is unremarkable. There are multiple diverticula mainly in the sigmoid colon, without imaging signs of diverticulitis. Vascular/Lymphatic: No ascites or pneumoperitoneum. No abdominal or pelvic lymphadenopathy, by size criteria. No aneurysmal dilation of the major abdominal arteries. There are mild peripheral atherosclerotic vascular calcifications of the aorta and its major branches. Reproductive: Enlarged prostate. Symmetric seminal vesicles. Other: There are fat containing umbilical and bilateral inguinal hernias. The soft tissues and abdominal wall are otherwise unremarkable. Musculoskeletal: No suspicious osseous lesions. There are moderate multilevel degenerative changes in the visualized spine. Review of the MIP images confirms the above findings. IMPRESSION: 1. No acute aortic syndrome. No thoracic aortic intramural hematoma. No aneurysm, dissection or penetrating atherosclerotic ulcer of the thoracoabdominal aorta. 2. Redemonstration of moderate sized hiatal hernia containing portion of the stomach, essentially similar to the prior study. There is also markedly dilated and fluid-filled entire esophagus, which may be due to chronic gastroesophageal reflux disease or esophageal dysmotility. Consider GI consultation. 3. Multiple other nonacute observations (such as left renal cortical atrophic, bilateral renal calculi, right more than left, bilateral renal cysts, prostatomegaly, etc.), as described above. Aortic Atherosclerosis  (ICD10-I70.0). Electronically Signed   By: Jules Schick M.D.   On: 11/01/2023 09:29   DG Chest Portable 1 View Result Date: 11/01/2023 CLINICAL DATA:  73 year old male with history of chest pain nausea and vomiting. EXAM: PORTABLE CHEST 1 VIEW COMPARISON:  Chest x-ray 08/13/2023. FINDINGS: Lung volumes are low. No consolidative airspace disease. Diffuse interstitial prominence and widespread peribronchial cuffing. No pneumothorax. No evidence of pulmonary edema. Heart size appears borderline enlarged. Large hiatal hernia. Upper mediastinal contours are within normal limits given patient positioning. IMPRESSION: 1. The appearance of the chest suggests bronchitis. 2. Large hiatal hernia. Electronically Signed   By: Trudie Reed M.D.   On: 11/01/2023 07:35    Procedures .Critical Care  Performed by: Loetta Rough, MD Authorized by: Loetta Rough, MD   Critical care provider statement:    Critical care time (minutes):  45   Critical care was necessary to treat or prevent imminent or life-threatening deterioration of the following conditions:  Dehydration   Critical care was time spent personally by me on the following activities:  Development of treatment plan with patient or surrogate, discussions with consultants, evaluation of patient's response to treatment, examination of patient, ordering and review of laboratory studies, ordering and review of radiographic studies, ordering  and performing treatments and interventions, pulse oximetry, re-evaluation of patient's condition, review of old charts and obtaining history from patient or surrogate   {Document cardiac monitor, telemetry assessment procedure when appropriate:1}  Medications Ordered in ED Medications  metoprolol tartrate (LOPRESSOR) injection 5 mg (has no administration in time range)  lactated ringers bolus 1,000 mL (1,000 mLs Intravenous New Bag/Given 11/01/23 0742)  morphine (PF) 4 MG/ML injection 4 mg (4 mg Intravenous  Given 11/01/23 0742)  ondansetron (ZOFRAN) injection 4 mg (4 mg Intravenous Given 11/01/23 0741)  pantoprazole (PROTONIX) injection 40 mg (40 mg Intravenous Given 11/01/23 0749)  morphine (PF) 4 MG/ML injection 4 mg (4 mg Intravenous Given 11/01/23 0824)  lactated ringers bolus 1,000 mL (1,000 mLs Intravenous New Bag/Given 11/01/23 1001)  iohexol (OMNIPAQUE) 350 MG/ML injection 75 mL (75 mLs Intravenous Contrast Given 11/01/23 0850)  alum & mag hydroxide-simeth (MAALOX/MYLANTA) 200-200-20 MG/5ML suspension 30 mL (30 mLs Oral Given 11/01/23 1035)    And  lidocaine (XYLOCAINE) 2 % viscous mouth solution 15 mL (15 mLs Oral Given 11/01/23 1035)  metoCLOPramide (REGLAN) injection 10 mg (10 mg Intravenous Given 11/01/23 1035)  fentaNYL (SUBLIMAZE) injection 50 mcg (50 mcg Intravenous Given 11/01/23 1123)    ED Course/ Medical Decision Making/ A&P                          Medical Decision Making Amount and/or Complexity of Data Reviewed Labs: ordered. Decision-making details documented in ED Course. Radiology: ordered. Decision-making details documented in ED Course.  Risk OTC drugs. Prescription drug management. Decision regarding hospitalization.    This patient presents to the ED for concern of chest pain, abdominal pain, nausea vomiting, this involves an extensive number of treatment options, and is a complaint that carries with it a high risk of complications and morbidity.  I considered the following differential and admission for this acute, potentially life threatening condition.   MDM:    Per chart review had a CT chest for lung cancer screening on 09/29/2023 which demonstrated a large hiatal hernia, nonobstructing right renal stone.   Patient reports severe stabbing chest and abdominal pain that is worse than it has ever been before during his prior presentations.  He states the pain is similar but worse this time.  I do have higher suspicion that this patient's pain is likely related to  his hiatal hernia and possible reflux or gastritis, as has been diagnosed before, but ACS, dissection, pancreatitis, gastric or intestinal perforation are also still on the differential given that his pain seems to be worse this time.  He denies any hematemesis and has no mediastinal widening or air noted on the chest x-ray, lower suspicion for Boerhaave syndrome.  Patient's EKG also demonstrates A-fib with RVR, however I have lower suspicion that patient's chest pain is related to the atrial fibrillation and that he does not need an emergent cardioversion at this time but rather is likely very dehydrated and could not tolerate his oral medications this morning including metoprolol.  Will begin treatment of this with fluid resuscitation and reassess.   Clinical Course as of 11/01/23 1147  Fri Nov 01, 2023  0743 DG Chest Portable 1 View 1. The appearance of the chest suggests bronchitis. 2. Large hiatal hernia.   [HN]  0843 Lactic Acid, Venous(!!): 3.4 Receiving fluid [HN]  0843 Lipase(!): 78 neg [HN]  0843 Troponin I (High Sensitivity)(!): 25 Will get delta [HN]  0843 Creatinine(!): 1.85 C/w baseline [HN]  1610 Anion gap(!): 21 Mildly elevated anion gap [HN]  1016 CT Angio Chest/Abd/Pel for Dissection W and/or Wo Contrast 1. No acute aortic syndrome. No thoracic aortic intramural hematoma. No aneurysm, dissection or penetrating atherosclerotic ulcer of the thoracoabdominal aorta. 2. Redemonstration of moderate sized hiatal hernia containing portion of the stomach, essentially similar to the prior study. There is also markedly dilated and fluid-filled entire esophagus, which may be due to chronic gastroesophageal reflux disease or esophageal dysmotility. Consider GI consultation. 3. Multiple other nonacute observations (such as left renal cortical atrophic, bilateral renal calculi, right more than left, bilateral renal cysts, prostatomegaly, etc.), as described above.   [HN]  1016  Pts symptoms have improved w/ GI cocktail in the past [HN]  1056 Troponin I (High Sensitivity)(!): 30 flat [HN]  1112 Tried GI cocktail for pain control and it didn't help. Patient given reglan IV for continued nausea. Will also trial fentanyl for pain as he still reports 8/10 pain. Will d/w GI for any further recs for possible esophageal spasm or hiatal hernia cause of pain. [HN]  1118 Discussed with GI Dr. Marletta Lor who recommends trying another GI cocktail and if that is not successful he can be admitted to the hospital for an upper endoscopy tomorrow morning. [HN]  1121 Still was also consider nongastrointestinal cause of his chest pain.  Patient remains vomiting diaphoretic and tachycardic.  His CT does not demonstrate any dissection or pulmonary embolism.  His troponins are flat which is reassuring against ACS.  CT not the best imaging modality to diagnose pericardial effusion but there is also no pericardial effusion noted on the CT scan.  With his hiatal hernia and markedly dilated esophagus it does seem as though his chest pain is likely gastrointestinal in nature. [HN]  1143 Patient feeling moderately better after the fentanyl and Reglan.  Despite 2 L of fluid patient's heart rate still in the 120s to 130s A-fib with RVR.  Patient did not take his medications this morning due to vomiting, will give him metoprolol IV.  [HN]    Clinical Course User Index [HN] Loetta Rough, MD    Labs: I Ordered, and personally interpreted labs.  The pertinent results include:  those listed above  Imaging Studies ordered: I ordered imaging studies including CXR, CT dissection study I independently visualized and interpreted imaging. I agree with the radiologist interpretation  Additional history obtained from chart review, fiancee at bedside.   Cardiac Monitoring: The patient was maintained on a cardiac monitor.  I personally viewed and interpreted the cardiac monitored which showed an underlying rhythm  of: Sinus tachycardia  Reevaluation: After the interventions noted above, I reevaluated the patient and found that they have :improved  Social Determinants of Health:  lives independently  Disposition:  ***  Co morbidities that complicate the patient evaluation  Past Medical History:  Diagnosis Date   Chest pain, exertional    Chronic kidney disease    kidney stones   Hiatal hernia    Hyperlipidemia    Hypertension    Skin cancer    Splenic infarct      Medicines Meds ordered this encounter  Medications   lactated ringers bolus 1,000 mL   morphine (PF) 4 MG/ML injection 4 mg    Refill:  0   ondansetron (ZOFRAN) injection 4 mg   pantoprazole (PROTONIX) injection 40 mg   morphine (PF) 4 MG/ML injection 4 mg    Refill:  0   lactated ringers bolus 1,000 mL  iohexol (OMNIPAQUE) 350 MG/ML injection 75 mL   AND Linked Order Group    alum & mag hydroxide-simeth (MAALOX/MYLANTA) 200-200-20 MG/5ML suspension 30 mL    lidocaine (XYLOCAINE) 2 % viscous mouth solution 15 mL   metoCLOPramide (REGLAN) injection 10 mg   fentaNYL (SUBLIMAZE) injection 50 mcg   metoprolol tartrate (LOPRESSOR) injection 5 mg    I have reviewed the patients home medicines and have made adjustments as needed  Problem List / ED Course: Problem List Items Addressed This Visit   None        {Document critical care time when appropriate:1} {Document review of labs and clinical decision tools ie heart score, Chads2Vasc2 etc:1}  {Document your independent review of radiology images, and any outside records:1} {Document your discussion with family members, caretakers, and with consultants:1} {Document social determinants of health affecting pt's care:1} {Document your decision making why or why not admission, treatments were needed:1}  This note was created using dictation software, which may contain spelling or grammatical errors.

## 2023-11-01 NOTE — Discharge Instructions (Addendum)
Thank you for coming to [facility] Emergency Department. You were seen for chest pain, nausea/vomiting. We did an exam, labs, and imaging, and these showed atrial fibrillation with rapid ventricular rate, dehydration, your hiatal hernia, bronchitis, and a dilated esophagus.  We recommended admission but you elected to leave AGAINST MEDICAL ADVICE.  You understand the risks of leaving, including heart attack, worsening of your condition, gastric perforation, and significant morbidity up to and including death.  For your nausea we have prescribed Zofran which you can take under the tongue every 6-8 hours as needed for nausea and vomiting.  For your cough we have prescribed promethazine cough syrup which will also treat nausea. Do not drive or operate machinery while taking this cough syrup.  Please return to the emergency department at your earliest convenience.  If you not want to return to the emergency department, please follow-up with your cardiologist and gastroenterologist within 1 week.  Dr. Marletta Lor with GI stated that he would provide you with an endoscopy early next week.  Please call his office to schedule this visit.

## 2023-11-30 DEATH — deceased

## 2024-08-07 ENCOUNTER — Other Ambulatory Visit: Payer: Medicare HMO

## 2024-08-14 ENCOUNTER — Ambulatory Visit: Payer: Medicare HMO | Admitting: Oncology
